# Patient Record
Sex: Male | Born: 1970 | Race: White | Hispanic: No | Marital: Married | Smoking: Former smoker
Health system: Southern US, Community
[De-identification: ages and names within clinical notes are randomized; demographics above are authoritative.]

## PROBLEM LIST (undated history)

## (undated) DIAGNOSIS — J189 Pneumonia, unspecified organism: Secondary | ICD-10-CM

## (undated) DIAGNOSIS — F32A Depression, unspecified: Secondary | ICD-10-CM

## (undated) DIAGNOSIS — R51 Headache: Secondary | ICD-10-CM

## (undated) DIAGNOSIS — F329 Major depressive disorder, single episode, unspecified: Secondary | ICD-10-CM

## (undated) DIAGNOSIS — E7800 Pure hypercholesterolemia, unspecified: Secondary | ICD-10-CM

## (undated) DIAGNOSIS — K219 Gastro-esophageal reflux disease without esophagitis: Secondary | ICD-10-CM

## (undated) HISTORY — PX: APPENDECTOMY: SHX54

---

## 2007-04-08 ENCOUNTER — Emergency Department (HOSPITAL_COMMUNITY): Admission: EM | Admit: 2007-04-08 | Discharge: 2007-04-08 | Payer: Self-pay | Admitting: Emergency Medicine

## 2008-04-18 ENCOUNTER — Emergency Department (HOSPITAL_COMMUNITY): Admission: EM | Admit: 2008-04-18 | Discharge: 2008-04-18 | Payer: Self-pay | Admitting: Emergency Medicine

## 2009-02-24 ENCOUNTER — Emergency Department (HOSPITAL_COMMUNITY): Admission: EM | Admit: 2009-02-24 | Discharge: 2009-02-24 | Payer: Self-pay | Admitting: Emergency Medicine

## 2009-03-15 ENCOUNTER — Emergency Department (HOSPITAL_COMMUNITY): Admission: EM | Admit: 2009-03-15 | Discharge: 2009-03-15 | Payer: Self-pay | Admitting: Emergency Medicine

## 2009-04-26 ENCOUNTER — Ambulatory Visit (HOSPITAL_COMMUNITY): Admission: RE | Admit: 2009-04-26 | Discharge: 2009-04-26 | Payer: Self-pay | Admitting: Family Medicine

## 2009-05-29 ENCOUNTER — Emergency Department (HOSPITAL_COMMUNITY): Admission: EM | Admit: 2009-05-29 | Discharge: 2009-05-29 | Payer: Self-pay | Admitting: Emergency Medicine

## 2009-07-11 ENCOUNTER — Emergency Department (HOSPITAL_COMMUNITY): Admission: EM | Admit: 2009-07-11 | Discharge: 2009-07-12 | Payer: Self-pay | Admitting: Emergency Medicine

## 2011-01-11 LAB — RAPID URINE DRUG SCREEN, HOSP PERFORMED
Benzodiazepines: NOT DETECTED
Cocaine: NOT DETECTED
Opiates: NOT DETECTED

## 2011-01-11 LAB — DIFFERENTIAL
Basophils Absolute: 0.1 10*3/uL (ref 0.0–0.1)
Eosinophils Relative: 6 % — ABNORMAL HIGH (ref 0–5)
Lymphs Abs: 5.9 10*3/uL — ABNORMAL HIGH (ref 0.7–4.0)
Monocytes Absolute: 0.8 10*3/uL (ref 0.1–1.0)
Neutro Abs: 5.3 10*3/uL (ref 1.7–7.7)
Neutrophils Relative %: 41 % — ABNORMAL LOW (ref 43–77)

## 2011-01-11 LAB — COMPREHENSIVE METABOLIC PANEL
Albumin: 4.5 g/dL (ref 3.5–5.2)
CO2: 32 mEq/L (ref 19–32)
Calcium: 9.7 mg/dL (ref 8.4–10.5)
Chloride: 102 mEq/L (ref 96–112)
Creatinine, Ser: 0.96 mg/dL (ref 0.4–1.5)
Glucose, Bld: 93 mg/dL (ref 70–99)
Potassium: 3.7 mEq/L (ref 3.5–5.1)

## 2011-01-11 LAB — CBC
MCHC: 35.2 g/dL (ref 30.0–36.0)
MCV: 86.5 fL (ref 78.0–100.0)
RDW: 13.2 % (ref 11.5–15.5)
WBC: 12.8 10*3/uL — ABNORMAL HIGH (ref 4.0–10.5)

## 2011-01-15 LAB — DIFFERENTIAL
Basophils Relative: 1 % (ref 0–1)
Eosinophils Absolute: 0.6 10*3/uL (ref 0.0–0.7)
Eosinophils Relative: 5 % (ref 0–5)
Lymphs Abs: 4.1 10*3/uL — ABNORMAL HIGH (ref 0.7–4.0)
Monocytes Absolute: 0.9 10*3/uL (ref 0.1–1.0)
Monocytes Relative: 7 % (ref 3–12)
Neutro Abs: 7 10*3/uL (ref 1.7–7.7)
Neutrophils Relative %: 55 % (ref 43–77)

## 2011-01-15 LAB — COMPREHENSIVE METABOLIC PANEL
Albumin: 4.1 g/dL (ref 3.5–5.2)
Alkaline Phosphatase: 71 U/L (ref 39–117)
Calcium: 9.9 mg/dL (ref 8.4–10.5)
Chloride: 101 mEq/L (ref 96–112)
Creatinine, Ser: 0.97 mg/dL (ref 0.4–1.5)
Glucose, Bld: 92 mg/dL (ref 70–99)
Sodium: 138 mEq/L (ref 135–145)
Total Bilirubin: 0.4 mg/dL (ref 0.3–1.2)

## 2011-01-15 LAB — CBC
Platelets: 263 10*3/uL (ref 150–400)
RDW: 13.1 % (ref 11.5–15.5)

## 2011-01-15 LAB — LIPASE, BLOOD: Lipase: 23 U/L (ref 11–59)

## 2011-01-16 LAB — BASIC METABOLIC PANEL
BUN: 7 mg/dL (ref 6–23)
CO2: 29 mEq/L (ref 19–32)
Chloride: 102 mEq/L (ref 96–112)
Creatinine, Ser: 0.94 mg/dL (ref 0.4–1.5)
Potassium: 3.8 mEq/L (ref 3.5–5.1)

## 2011-01-16 LAB — DIFFERENTIAL
Eosinophils Absolute: 0.4 10*3/uL (ref 0.0–0.7)
Eosinophils Relative: 4 % (ref 0–5)

## 2011-01-16 LAB — CBC
Hemoglobin: 16.3 g/dL (ref 13.0–17.0)
Platelets: 249 10*3/uL (ref 150–400)
RDW: 13.4 % (ref 11.5–15.5)
WBC: 10.4 10*3/uL (ref 4.0–10.5)

## 2011-01-16 LAB — POCT CARDIAC MARKERS

## 2011-01-23 ENCOUNTER — Emergency Department (HOSPITAL_COMMUNITY): Payer: Medicaid Other

## 2011-01-23 ENCOUNTER — Emergency Department (HOSPITAL_COMMUNITY)
Admission: EM | Admit: 2011-01-23 | Discharge: 2011-01-23 | Disposition: A | Discharge status: Home / Self Care | Payer: Medicaid Other | Attending: Emergency Medicine | Admitting: Emergency Medicine

## 2011-01-23 DIAGNOSIS — R112 Nausea with vomiting, unspecified: Secondary | ICD-10-CM | POA: Insufficient documentation

## 2011-01-23 DIAGNOSIS — R05 Cough: Secondary | ICD-10-CM | POA: Insufficient documentation

## 2011-01-23 DIAGNOSIS — R059 Cough, unspecified: Secondary | ICD-10-CM | POA: Insufficient documentation

## 2011-01-23 DIAGNOSIS — B9789 Other viral agents as the cause of diseases classified elsewhere: Secondary | ICD-10-CM | POA: Insufficient documentation

## 2011-10-27 ENCOUNTER — Emergency Department (HOSPITAL_COMMUNITY)
Admission: EM | Admit: 2011-10-27 | Discharge: 2011-10-28 | Disposition: A | Discharge status: Home / Self Care | Payer: Medicaid Other | Attending: Emergency Medicine | Admitting: Emergency Medicine

## 2011-10-27 ENCOUNTER — Encounter (HOSPITAL_COMMUNITY): Payer: Self-pay | Admitting: *Deleted

## 2011-10-27 DIAGNOSIS — F329 Major depressive disorder, single episode, unspecified: Secondary | ICD-10-CM | POA: Insufficient documentation

## 2011-10-27 DIAGNOSIS — R05 Cough: Secondary | ICD-10-CM

## 2011-10-27 DIAGNOSIS — E7800 Pure hypercholesterolemia, unspecified: Secondary | ICD-10-CM | POA: Insufficient documentation

## 2011-10-27 DIAGNOSIS — R1907 Generalized intra-abdominal and pelvic swelling, mass and lump: Secondary | ICD-10-CM | POA: Insufficient documentation

## 2011-10-27 DIAGNOSIS — F3289 Other specified depressive episodes: Secondary | ICD-10-CM | POA: Insufficient documentation

## 2011-10-27 DIAGNOSIS — R059 Cough, unspecified: Secondary | ICD-10-CM | POA: Insufficient documentation

## 2011-10-27 HISTORY — DX: Pure hypercholesterolemia, unspecified: E78.00

## 2011-10-27 HISTORY — DX: Major depressive disorder, single episode, unspecified: F32.9

## 2011-10-27 HISTORY — DX: Depression, unspecified: F32.A

## 2011-10-27 NOTE — ED Notes (Signed)
Pt c/o cough, lower abdominal pressure, and diarrhea for "some time now."

## 2011-10-28 LAB — URINALYSIS, ROUTINE W REFLEX MICROSCOPIC
Bilirubin Urine: NEGATIVE
Glucose, UA: NEGATIVE mg/dL
Ketones, ur: NEGATIVE mg/dL
Leukocytes, UA: NEGATIVE
Nitrite: NEGATIVE
Protein, ur: NEGATIVE mg/dL
Specific Gravity, Urine: >1.03 — ABNORMAL HIGH (ref 1.005–1.030)
Urobilinogen, UA: 0.2 mg/dL (ref 0.0–1.0)

## 2011-10-28 NOTE — ED Provider Notes (Signed)
History     CSN: [id number]  Arrival date & time 10/27/11  2345   First MD Initiated Contact with Patient 10/28/11 0007      Chief Complaint  Patient presents with  . Abdominal Pain  . Cough    (Consider location/radiation/quality/duration/timing/severity/associated sxs/prior treatment) HPI Patient presents with complaint of mild cough, sensation of abdominal swelling. He states the symptoms have been well on for several months. He states he has seen his primary doctor for the same symptoms. He states he sometimes feels dysuria. He's had no vomiting and no fevers or chills. He denies any abdominal surgeries. He has been eating and drinking normally. He endorses having had these symptoms multiple times in the past without any serious diagnosis. He denies any blood in his stool or in his urine. He states he stopped smoking 2 years ago. He's had no chest pain or difficulty breathing. Symptoms are mild. Symptoms are continuous. There no other alleviating or modifying factors. There no other associated systemic symptoms.  Past Medical History  Diagnosis Date  . Depression   . High cholesterol     History reviewed. No pertinent past surgical history.  History reviewed. No pertinent family history.  History  Substance Use Topics  . Smoking status: Not on file  . Smokeless tobacco: Not on file  . Alcohol Use: Yes     rarely      Review of Systems ROS reviewed and otherwise negative except for mentioned in HPI  Allergies  Darvocet  Home Medications   Current Outpatient Rx  Name Route Sig Dispense Refill  . PAROXETINE HCL 20 MG PO TABS Oral Take 20 mg by mouth every morning.    Marland Kitchen PRAVASTATIN SODIUM 40 MG PO TABS Oral Take 40 mg by mouth daily.      BP 153/92  Pulse 75  Temp 98 F (36.7 C)  Resp 20  Ht 6' (1.829 m)  Wt 174 lb (78.926 kg)  BMI 23.60 kg/m2  SpO2 100% Vitals reviewed Physical Exam Physical Examination: General appearance - alert, well appearing, and  in no distress Mental status - alert, oriented to person, place, and time Eyes - pupils equal and reactive, extraocular eye movements intact Mouth - mucous membranes moist, pharynx normal without lesions Chest - clear to auscultation, no wheezes, rales or rhonchi, symmetric air entry, no increased respiratory effort Heart - normal rate, regular rhythm, normal S1, S2, no murmurs, rubs, clicks or gallops Abdomen - soft, nontender, nondistended, no masses or organomegaly, normal active bowel sounds Skin - normal coloration and turgor, no rashes, no suspicious skin lesions noted  ED Course  Procedures (including critical care time)  Prior chart, ED visits and xrays reviewed by me during this visit  Labs Reviewed  URINALYSIS, ROUTINE W REFLEX MICROSCOPIC - Abnormal; Notable for the following:    Specific Gravity, Urine >1.030 (*)    All other components within normal limits   No results found.   1. Cough   2. Generalized abdominal swelling       MDM  Patient presents with complaint of sensation of abdominal swelling which has been ongoing for months. His abdomen is nontender nondistended there are normal bowel sounds. He also complains of mild cough. His lungs are clear there is no wheezing. He states he is not a smoker. He is nontoxic and well-hydrated in appearance. The urinalysis revealed increased specific gravity. He was advised to increase his fluid intake. Otherwise I do not suspect an acute emergent condition at  this time. And the patient is recommended to followup with his primary doctor or for a recheck. He was given strict return precautions and is agreeable with this plan.        Ethelda Chick, MD 10/28/11 [phone]

## 2012-03-27 ENCOUNTER — Encounter (HOSPITAL_COMMUNITY): Payer: Self-pay

## 2012-03-27 ENCOUNTER — Emergency Department (HOSPITAL_COMMUNITY)
Admission: EM | Admit: 2012-03-27 | Discharge: 2012-03-27 | Disposition: A | Discharge status: Home / Self Care | Payer: Medicaid Other | Attending: Emergency Medicine | Admitting: Emergency Medicine

## 2012-03-27 DIAGNOSIS — J029 Acute pharyngitis, unspecified: Secondary | ICD-10-CM

## 2012-03-27 DIAGNOSIS — E7800 Pure hypercholesterolemia, unspecified: Secondary | ICD-10-CM | POA: Insufficient documentation

## 2012-03-27 LAB — RAPID STREP SCREEN (MED CTR MEBANE ONLY): Streptococcus, Group A Screen (Direct): NEGATIVE

## 2012-03-27 MED ORDER — IBUPROFEN 800 MG PO TABS: 800.0000 mg | ORAL_TABLET | Freq: Three times a day (TID) | ORAL | Status: AC

## 2012-03-27 MED ORDER — MINOCYCLINE HCL 100 MG PO CAPS: 100.0000 mg | ORAL_CAPSULE | Freq: Two times a day (BID) | ORAL | Status: AC

## 2012-03-27 NOTE — ED Provider Notes (Signed)
History     CSN: [id number]  Arrival date & time 03/27/12  1726   First MD Initiated Contact with Patient 03/27/12 1808      Chief Complaint  Patient presents with  . Sore Throat    (Consider location/radiation/quality/duration/timing/severity/associated sxs/prior treatment) Patient is a 41 y.o. male presenting with pharyngitis. The history is provided by the patient.  Sore Throat This is a new problem. The current episode started in the past 7 days. The problem occurs 2 to 4 times per day. The problem has been unchanged. Associated symptoms include myalgias and a sore throat. Pertinent negatives include no abdominal pain, arthralgias, chest pain, congestion, coughing, neck pain or rash. Associated symptoms comments: hoarseness. The symptoms are aggravated by swallowing. He has tried nothing for the symptoms. The treatment provided no relief.    Past Medical History  Diagnosis Date  . Depression   . High cholesterol     History reviewed. No pertinent past surgical history.  No family history on file.  History  Substance Use Topics  . Smoking status: Never Smoker   . Smokeless tobacco: Not on file  . Alcohol Use: Yes     rarely      Review of Systems  Constitutional: Negative for activity change.       All ROS Neg except as noted in HPI  HENT: Positive for sore throat. Negative for nosebleeds, congestion and neck pain.   Eyes: Negative for photophobia and discharge.  Respiratory: Negative for cough, shortness of breath and wheezing.   Cardiovascular: Negative for chest pain and palpitations.  Gastrointestinal: Negative for abdominal pain and blood in stool.  Genitourinary: Negative for dysuria, frequency and hematuria.  Musculoskeletal: Positive for myalgias. Negative for back pain and arthralgias.  Skin: Negative.  Negative for rash.  Neurological: Negative for dizziness, seizures and speech difficulty.  Psychiatric/Behavioral: Negative for hallucinations and  confusion.    Allergies  Darvocet  Home Medications   Current Outpatient Rx  Name Route Sig Dispense Refill  . IBUPROFEN 800 MG PO TABS Oral Take 1 tablet (800 mg total) by mouth 3 (three) times daily. 21 tablet 0  . MINOCYCLINE HCL 100 MG PO CAPS Oral Take 1 capsule (100 mg total) by mouth 2 (two) times daily. 12 capsule 0  . PAROXETINE HCL 20 MG PO TABS Oral Take 20 mg by mouth every morning.    Marland Kitchen PRAVASTATIN SODIUM 40 MG PO TABS Oral Take 40 mg by mouth daily.      BP 128/89  Pulse 86  Temp 98.1 F (36.7 C) (Oral)  Resp 20  Ht 6' (1.829 m)  Wt 174 lb (78.926 kg)  BMI 23.60 kg/m2  SpO2 99%  Physical Exam  Nursing note and vitals reviewed. Constitutional: He is oriented to person, place, and time. He appears well-developed and well-nourished.  Non-toxic appearance.  HENT:  Head: Normocephalic.  Right Ear: Tympanic membrane and external ear normal.  Left Ear: Tympanic membrane and external ear normal.       There is increased redness of the posterior pharynx. There is no evidence of abscess. The uvula is enlarged but in the midline. There is hoarseness present.  Eyes: EOM and lids are normal. Pupils are equal, round, and reactive to light.  Neck: Normal range of motion. Neck supple. Carotid bruit is not present.  Cardiovascular: Normal rate, regular rhythm, normal heart sounds, intact distal pulses and normal pulses.   Pulmonary/Chest: Breath sounds normal. No respiratory distress.  Abdominal: Soft. Bowel sounds  are normal. There is no tenderness. There is no guarding.  Musculoskeletal: Normal range of motion.  Lymphadenopathy:       Head (right side): No submandibular adenopathy present.       Head (left side): No submandibular adenopathy present.    He has no cervical adenopathy.  Neurological: He is alert and oriented to person, place, and time. He has normal strength. No cranial nerve deficit or sensory deficit.  Skin: Skin is warm and dry.  Psychiatric: He has a  normal mood and affect. His speech is normal.    ED Course  Procedures (including critical care time)   Labs Reviewed  RAPID STREP SCREEN   No results found.   1. Pharyngitis       MDM  I have reviewed nursing notes, vital signs, and all appropriate lab and imaging results for this patient. Patient advised to use salt water gargles. Prescription for ibuprofen 3 times daily with food, and minutes and 2 times daily with food given to the patient. Patient is invited to return if any changes, problems, or concerns.       Kathie Dike, [place] 03/27/12 [phone]

## 2012-03-27 NOTE — ED Notes (Signed)
Pt reports losing voice 2-3 days ago, sore throat at times.

## 2012-03-27 NOTE — ED Notes (Signed)
Pt c/o sore throat x 2-3 days. Strep screen done.

## 2012-03-27 NOTE — Discharge Instructions (Signed)

## 2012-03-27 NOTE — ED Provider Notes (Signed)
Medical screening examination/treatment/procedure(s) were performed by non-physician practitioner and as supervising physician I was immediately available for consultation/collaboration.   Tiant Peixoto, MD 03/27/12 1923 

## 2012-04-04 ENCOUNTER — Emergency Department (HOSPITAL_COMMUNITY)
Admission: EM | Admit: 2012-04-04 | Discharge: 2012-04-04 | Disposition: A | Discharge status: Home / Self Care | Payer: Medicaid Other | Attending: Emergency Medicine | Admitting: Emergency Medicine

## 2012-04-04 ENCOUNTER — Encounter (HOSPITAL_COMMUNITY): Payer: Self-pay | Admitting: *Deleted

## 2012-04-04 DIAGNOSIS — E7800 Pure hypercholesterolemia, unspecified: Secondary | ICD-10-CM | POA: Insufficient documentation

## 2012-04-04 DIAGNOSIS — F329 Major depressive disorder, single episode, unspecified: Secondary | ICD-10-CM | POA: Insufficient documentation

## 2012-04-04 DIAGNOSIS — Z79899 Other long term (current) drug therapy: Secondary | ICD-10-CM | POA: Insufficient documentation

## 2012-04-04 DIAGNOSIS — R112 Nausea with vomiting, unspecified: Secondary | ICD-10-CM | POA: Insufficient documentation

## 2012-04-04 DIAGNOSIS — F3289 Other specified depressive episodes: Secondary | ICD-10-CM | POA: Insufficient documentation

## 2012-04-04 DIAGNOSIS — R197 Diarrhea, unspecified: Secondary | ICD-10-CM | POA: Insufficient documentation

## 2012-04-04 MED ORDER — ONDANSETRON 8 MG PO TBDP
8.0000 mg | ORAL_TABLET | Freq: Once | ORAL | Status: AC
  Administered 2012-04-04: 8 mg via ORAL
  Filled 2012-04-04: qty 1

## 2012-04-04 MED ORDER — ACETAMINOPHEN 325 MG PO TABS
650.0000 mg | ORAL_TABLET | Freq: Once | ORAL | Status: AC
  Administered 2012-04-04: 650 mg via ORAL
  Filled 2012-04-04: qty 2

## 2012-04-04 NOTE — Discharge Instructions (Signed)
Diet for Diarrhea, Adult Having frequent, runny stools (diarrhea) has many causes. Diarrhea may be caused or worsened by food or drink. Diarrhea may be relieved by changing your diet. IF YOU ARE NOT TOLERATING SOLID FOODS:  Drink enough water and fluids to keep your urine clear or pale yellow.   Avoid sugary drinks and sodas as well as milk-based beverages.   Avoid beverages containing caffeine and alcohol.   You may try rehydrating beverages. You can make your own by following this recipe:    tsp table salt.    tsp baking soda.   ? tsp salt substitute (potassium chloride).   1 tbs + 1 tsp sugar.   1 qt water.  As your stools become more solid, you can start eating solid foods. Add foods one at a time. If a certain food causes your diarrhea to get worse, avoid that food and try other foods. A low fiber, low-fat, and lactose-free diet is recommended. Small, frequent meals may be better tolerated.  Starches  Allowed:  White, French, and pita breads, plain rolls, buns, bagels. Plain muffins, matzo. Soda, saltine, or graham crackers. Pretzels, melba toast, zwieback. Cooked cereals made with water: cornmeal, farina, cream cereals. Dry cereals: refined corn, wheat, rice. Potatoes prepared any way without skins, refined macaroni, spaghetti, noodles, refined rice.   Avoid:  Bread, rolls, or crackers made with whole wheat, multi-grains, rye, bran seeds, nuts, or coconut. Corn tortillas or taco shells. Cereals containing whole grains, multi-grains, bran, coconut, nuts, or raisins. Cooked or dry oatmeal. Coarse wheat cereals, granola. Cereals advertised as "high-fiber." Potato skins. Whole grain pasta, wild or brown rice. Popcorn. Sweet potatoes/yams. Sweet rolls, doughnuts, waffles, pancakes, sweet breads.  Vegetables  Allowed: Strained tomato and vegetable juices. Most well-cooked and canned vegetables without seeds. Fresh: Tender lettuce, cucumber without the skin, cabbage, spinach, bean  sprouts.   Avoid: Fresh, cooked, or canned: Artichokes, baked beans, beet greens, broccoli, Brussels sprouts, corn, kale, legumes, peas, sweet potatoes. Cooked: Green or red cabbage, spinach. Avoid large servings of any vegetables, because vegetables shrink when cooked, and they contain more fiber per serving than fresh vegetables.  Fruit  Allowed: All fruit juices except prune juice. Cooked or canned: Apricots, applesauce, cantaloupe, cherries, fruit cocktail, grapefruit, grapes, kiwi, mandarin oranges, peaches, pears, plums, watermelon. Fresh: Apples without skin, ripe banana, grapes, cantaloupe, cherries, grapefruit, peaches, oranges, plums. Keep servings limited to  cup or 1 piece.   Avoid: Fresh: Apple with skin, apricots, mango, pears, raspberries, strawberries. Prune juice, stewed or dried prunes. Dried fruits, raisins, dates. Large servings of all fresh fruits.  Meat and Meat Substitutes  Allowed: Ground or well-cooked tender beef, ham, veal, lamb, pork, or poultry. Eggs, plain cheese. Fish, oysters, shrimp, lobster, other seafoods. Liver, organ meats.   Avoid: Tough, fibrous meats with gristle. Peanut butter, smooth or chunky. Cheese, nuts, seeds, legumes, dried peas, beans, lentils.  Milk  Allowed: Yogurt, lactose-free milk, kefir, drinkable yogurt, buttermilk, soy milk.   Avoid: Milk, chocolate milk, beverages made with milk, such as milk shakes.  Soups  Allowed: Bouillon, broth, or soups made from allowed foods. Any strained soup.   Avoid: Soups made from vegetables that are not allowed, cream or milk-based soups.  Desserts and Sweets  Allowed: Sugar-free gelatin, sugar-free frozen ice pops made without sugar alcohol.   Avoid: Plain cakes and cookies, pie made with allowed fruit, pudding, custard, cream pie. Gelatin, fruit, ice, sherbet, frozen ice pops. Ice cream, ice milk without nuts. Plain hard candy,   honey, jelly, molasses, syrup, sugar, chocolate syrup, gumdrops,  marshmallows.  Fats and Oils  Allowed: Avoid any fats and oils.   Avoid: Seeds, nuts, olives, avocados. Margarine, butter, cream, mayonnaise, salad oils, plain salad dressings made from allowed foods. Plain gravy, crisp bacon without rind.  Beverages  Allowed: Water, decaffeinated teas, oral rehydration solutions, sugar-free beverages.   Avoid: Fruit juices, caffeinated beverages (coffee, tea, soda or pop), alcohol, sports drinks, or lemon-lime soda or pop.  Condiments  Allowed: Ketchup, mustard, horseradish, vinegar, cream sauce, cheese sauce, cocoa powder. Spices in moderation: allspice, basil, bay leaves, celery powder or leaves, cinnamon, cumin powder, curry powder, ginger, mace, marjoram, onion or garlic powder, oregano, paprika, parsley flakes, ground pepper, rosemary, sage, savory, tarragon, thyme, turmeric.   Avoid: Coconut, honey.  Weight Monitoring: Weigh yourself every day. You should weigh yourself in the morning after you urinate and before you eat breakfast. Wear the same amount of clothing when you weigh yourself. Record your weight daily. Bring your recorded weights to your clinic visits. Tell your caregiver right away if you have gained 3 lb/1.4 kg or more in 1 day, 5 lb/2.3 kg in a week, or whatever amount you were told to report. SEEK IMMEDIATE MEDICAL CARE IF:   You are unable to keep fluids down.   You start to throw up (vomit) or diarrhea keeps coming back (persistent).   Abdominal pain develops, increases, or can be felt in one place (localizes).   You have an oral temperature above 102 F (38.9 C), not controlled by medicine.   Diarrhea contains blood or mucus.   You develop excessive weakness, dizziness, fainting, or extreme thirst.  MAKE SURE YOU:   Understand these instructions.   Will watch your condition.   Will get help right away if you are not doing well or get worse.  Document Released: 12/15/2003 Document Revised: 09/13/2011 Document Reviewed:  04/07/2009 [employer] Patient Information 2012 [employer]. 

## 2012-04-04 NOTE — ED Notes (Signed)
Pt ambulatory with steady gait and no complaints; No nausea at this time

## 2012-04-04 NOTE — ED Provider Notes (Signed)
History     CSN: [id number]  Arrival date & time 04/04/12  0129   First MD Initiated Contact with Patient 04/04/12 0133      Chief Complaint  Patient presents with  . Migraine  . Nausea  . Diarrhea     Patient is a 41 y.o. male presenting with diarrhea. The history is provided by the patient.  Diarrhea The primary symptoms include nausea, vomiting and diarrhea. Primary symptoms do not include fever, abdominal pain, melena, hematemesis, hematochezia or rash. The illness began today. The onset was gradual. The problem has been gradually worsening.  The illness is also significant for chills. Associated symptoms comments: Headache .   Pt reports that he had mild headache and soon after developed vomiting and diarrhea (nonbloody) Denies abdominal pain, no CP reported No fever No rash, no tick bites.  No cough.  He feels generalized weakness but no syncope No international travel No sick contacts, but does report recent stress with ill family member  Past Medical History  Diagnosis Date  . Depression   . High cholesterol     History reviewed. No pertinent past surgical history.  History reviewed. No pertinent family history.  History  Substance Use Topics  . Smoking status: Never Smoker   . Smokeless tobacco: Not on file  . Alcohol Use: Yes     rarely      Review of Systems  Constitutional: Positive for chills. Negative for fever.  Gastrointestinal: Positive for nausea, vomiting and diarrhea. Negative for abdominal pain, melena, hematochezia and hematemesis.  Skin: Negative for rash.  All other systems reviewed and are negative.    Allergies  Darvocet  Home Medications   Current Outpatient Rx  Name Route Sig Dispense Refill  . IBUPROFEN 800 MG PO TABS Oral Take 1 tablet (800 mg total) by mouth 3 (three) times daily. 21 tablet 0  . MINOCYCLINE HCL 100 MG PO CAPS Oral Take 1 capsule (100 mg total) by mouth 2 (two) times daily. 12 capsule 0  . PAROXETINE HCL  20 MG PO TABS Oral Take 20 mg by mouth every morning.    Marland Kitchen PRAVASTATIN SODIUM 40 MG PO TABS Oral Take 40 mg by mouth daily.      BP 115/73  Pulse 63  Temp 97.6 F (36.4 C) (Oral)  Resp 20  Ht 6' (1.829 m)  Wt 174 lb (78.926 kg)  BMI 23.60 kg/m2  SpO2 99%  Physical Exam CONSTITUTIONAL: Well developed/well nourished HEAD AND FACE: Normocephalic/atraumatic EYES: EOMI/PERRL, no scleral icterus ENMT: Mucous membranes moist NECK: supple no meningeal signs SPINE:entire spine nontender CV: S1/S2 noted, no murmurs/rubs/gallops noted Chest - pectus excavatum LUNGS: Lungs are clear to auscultation bilaterally, no apparent distress ABDOMEN: soft, nontender, no rebound or guarding, nondistended, +BS GU:no cva tenderness NEURO: Pt is awake/alert, moves all extremitiesx4 No arm/drift.  No facial droop.  No past pointing.  Normal gait EXTREMITIES: pulses normal, full ROM SKIN: warm, color normal PSYCH: no abnormalities of mood noted  ED Course  Procedures  Pt tolerating PO fluids Well appearing Stable for d/c    MDM  Nursing notes including past medical history and social history reviewed and considered in documentation         Joya Gaskins, MD 04/04/12 [phone]

## 2012-04-04 NOTE — ED Notes (Signed)
Pt started with ha with N/V/D. Pt reports "feeling like I am going to pass out" Associated sweating also today with hot flashes

## 2012-04-04 NOTE — ED Notes (Signed)
PO fluids given to pt. Pt tolerating at this time

## 2012-10-21 ENCOUNTER — Other Ambulatory Visit (HOSPITAL_COMMUNITY): Payer: Self-pay | Admitting: Family Medicine

## 2012-10-21 ENCOUNTER — Ambulatory Visit (HOSPITAL_COMMUNITY)
Admission: RE | Admit: 2012-10-21 | Discharge: 2012-10-21 | Disposition: A | Discharge status: Home / Self Care | Payer: Medicaid Other | Source: Ambulatory Visit | Attending: Family Medicine | Admitting: Family Medicine

## 2012-10-21 DIAGNOSIS — R05 Cough: Secondary | ICD-10-CM | POA: Insufficient documentation

## 2012-10-21 DIAGNOSIS — R918 Other nonspecific abnormal finding of lung field: Secondary | ICD-10-CM

## 2012-10-21 DIAGNOSIS — R059 Cough, unspecified: Secondary | ICD-10-CM | POA: Insufficient documentation

## 2012-10-21 DIAGNOSIS — R0602 Shortness of breath: Secondary | ICD-10-CM | POA: Insufficient documentation

## 2013-01-07 ENCOUNTER — Encounter (HOSPITAL_COMMUNITY): Payer: Self-pay | Admitting: *Deleted

## 2013-01-07 ENCOUNTER — Emergency Department (HOSPITAL_COMMUNITY): Payer: Medicaid Other

## 2013-01-07 ENCOUNTER — Emergency Department (HOSPITAL_COMMUNITY)
Admission: EM | Admit: 2013-01-07 | Discharge: 2013-01-07 | Disposition: A | Discharge status: Home / Self Care | Payer: Medicaid Other | Attending: Emergency Medicine | Admitting: Emergency Medicine

## 2013-01-07 DIAGNOSIS — Z79899 Other long term (current) drug therapy: Secondary | ICD-10-CM | POA: Insufficient documentation

## 2013-01-07 DIAGNOSIS — F329 Major depressive disorder, single episode, unspecified: Secondary | ICD-10-CM | POA: Insufficient documentation

## 2013-01-07 DIAGNOSIS — Y939 Activity, unspecified: Secondary | ICD-10-CM | POA: Insufficient documentation

## 2013-01-07 DIAGNOSIS — S139XXA Sprain of joints and ligaments of unspecified parts of neck, initial encounter: Secondary | ICD-10-CM | POA: Insufficient documentation

## 2013-01-07 DIAGNOSIS — F3289 Other specified depressive episodes: Secondary | ICD-10-CM | POA: Insufficient documentation

## 2013-01-07 DIAGNOSIS — Y929 Unspecified place or not applicable: Secondary | ICD-10-CM | POA: Insufficient documentation

## 2013-01-07 DIAGNOSIS — X58XXXA Exposure to other specified factors, initial encounter: Secondary | ICD-10-CM | POA: Insufficient documentation

## 2013-01-07 DIAGNOSIS — E7800 Pure hypercholesterolemia, unspecified: Secondary | ICD-10-CM | POA: Insufficient documentation

## 2013-01-07 DIAGNOSIS — S161XXA Strain of muscle, fascia and tendon at neck level, initial encounter: Secondary | ICD-10-CM

## 2013-01-07 MED ORDER — NAPROXEN 500 MG PO TABS: 500.0000 mg | ORAL_TABLET | Freq: Two times a day (BID) | ORAL | Status: DC

## 2013-01-07 MED ORDER — CYCLOBENZAPRINE HCL 10 MG PO TABS
10.0000 mg | ORAL_TABLET | Freq: Once | ORAL | Status: AC
  Administered 2013-01-07: 10 mg via ORAL
  Filled 2013-01-07: qty 1

## 2013-01-07 MED ORDER — CYCLOBENZAPRINE HCL 10 MG PO TABS: 10.0000 mg | ORAL_TABLET | Freq: Three times a day (TID) | ORAL | Status: DC | PRN

## 2013-01-07 MED ORDER — HYDROCODONE-ACETAMINOPHEN 5-325 MG PO TABS
1.0000 | ORAL_TABLET | Freq: Once | ORAL | Status: AC
  Administered 2013-01-07: 1 via ORAL
  Filled 2013-01-07: qty 1

## 2013-01-07 MED ORDER — TRAMADOL HCL 50 MG PO TABS: 50.0000 mg | ORAL_TABLET | Freq: Four times a day (QID) | ORAL | Status: DC | PRN

## 2013-01-07 NOTE — ED Notes (Signed)
Pt with neck pain for last couple days, denies injury to neck, hx of same about 4 years ago

## 2013-01-07 NOTE — ED Provider Notes (Signed)
History     CSN: [id number]  Arrival date & time 01/07/13  1836   First MD Initiated Contact with Patient 01/07/13 1915      Chief Complaint  Patient presents with  . Neck Pain    (Consider location/radiation/quality/duration/timing/severity/associated sxs/prior treatment) HPI Comments: patient is a 42 year old male who presents to the emergency department with complaint of right-sided neck pain for 2 days. He states that this is a reoccurring problem. Pain to his right knee began after an injury 4 years ago. He describes an aching pain to the right side of his neck and into his shoulder. Pain improves with certain positions and rest and worsened with movement of his neck and right arm. He denies any recent injury, chest pain, shortness of breath, headache, visual change, numbness or weakness of the upper extremities.  The history is provided by the patient.    Past Medical History  Diagnosis Date  . Depression   . High cholesterol     History reviewed. No pertinent past surgical history.  History reviewed. No pertinent family history.  History  Substance Use Topics  . Smoking status: Never Smoker   . Smokeless tobacco: Not on file  . Alcohol Use: Yes     Comment: rarely      Review of Systems  Constitutional: Negative for fever and chills.  HENT: Positive for neck pain. Negative for facial swelling, trouble swallowing and neck stiffness.   Eyes: Negative for photophobia and visual disturbance.  Respiratory: Negative for chest tightness and shortness of breath.   Cardiovascular: Negative for chest pain.  Gastrointestinal: Negative for nausea and vomiting.  Genitourinary: Negative for dysuria and difficulty urinating.  Musculoskeletal: Positive for joint swelling and arthralgias.  Skin: Negative for color change and wound.  Neurological: Negative for dizziness, syncope, facial asymmetry, speech difficulty, weakness, light-headedness, numbness and headaches.    Psychiatric/Behavioral: Negative for confusion. The patient is not nervous/anxious.   All other systems reviewed and are negative.    Allergies  Darvocet  Home Medications   Current Outpatient Rx  Name  Route  Sig  Dispense  Refill  . PARoxetine (PAXIL) 20 MG tablet   Oral   Take 20 mg by mouth every morning.          . pravastatin (PRAVACHOL) 40 MG tablet   Oral   Take 40 mg by mouth every morning.            BP 145/81  Pulse 80  Temp(Src) 97.5 F (36.4 C) (Oral)  Resp 20  Ht 6' (1.829 m)  Wt 174 lb (78.926 kg)  BMI 23.59 kg/m2  SpO2 99%  Physical Exam  Nursing note and vitals reviewed. Constitutional: He is oriented to person, place, and time. He appears well-developed and well-nourished. No distress.  HENT:  Head: Normocephalic and atraumatic.  Right Ear: Tympanic membrane and ear canal normal.  Left Ear: Tympanic membrane and ear canal normal.  Mouth/Throat: Uvula is midline, oropharynx is clear and moist and mucous membranes are normal. No oropharyngeal exudate.  Eyes: EOM are normal. Pupils are equal, round, and reactive to light.  Neck: Phonation normal. Neck supple. Muscular tenderness present. No spinous process tenderness present. No rigidity. No erythema present. No Brudzinski's sign and no Kernig's sign noted. No thyromegaly present.    ttp of the right cervical paraspinal muscles and right trapezius muscle.  Grip strength is strong and equal bilaterally.  Distal Sensation intact,  CR < 2 sec.  Pain to the neck  is reproduced with palpation and rotation of the neck to the right.  Cardiovascular: Normal rate, regular rhythm, normal heart sounds and intact distal pulses.   No murmur heard. Pulmonary/Chest: Effort normal and breath sounds normal. No respiratory distress. He exhibits no tenderness.  Musculoskeletal: He exhibits tenderness. He exhibits no edema.       Cervical back: He exhibits tenderness. He exhibits normal range of motion, no bony  tenderness, no swelling, no deformity, no spasm and normal pulse.  See neck exam  Lymphadenopathy:    He has no cervical adenopathy.  Neurological: He is alert and oriented to person, place, and time. No cranial nerve deficit or sensory deficit. He exhibits normal muscle tone. Coordination normal.  Reflex Scores:      Tricep reflexes are 2+ on the right side and 2+ on the left side.      Bicep reflexes are 2+ on the right side and 2+ on the left side. Skin: Skin is warm and dry.    ED Course  Procedures (including critical care time)  Labs Reviewed - No data to display No results found.  Dg Cervical Spine Complete  01/07/2013  *RADIOLOGY REPORT*  Clinical Data: Neck pain.  CERVICAL SPINE - COMPLETE 4+ VIEW  Comparison: None  Findings: The lateral film demonstrates normal alignment of the cervical vertebral bodies.  Disc spaces and vertebral bodies are maintained.  No acute bony findings or abnormal prevertebral soft tissue swelling.  The oblique films demonstrate normally aligned articular facets and patent neural foramen.  The C1-C2 articulations are maintained. The lung apices are clear.  Small cervical ribs are noted.  IMPRESSION: Normal alignment and no acute bony findings.   Original Report Authenticated By: Rudie Meyer, M.D.       MDM    Patient has ttp of the right cervical paraspinal muscles.  No focal neuro deficits on exam.  Ambulates with a steady gait, no meningeal signs, pt is well appearing.  Likely cervical strain.  Doubt infectious or cardiac process.  Will treat with flexeril, ultram and naproxen.  He agrees to f/u with Dr. Janna Arch for recheck  On re-eval, pt is feeling better, pain has improved and he is requesting to go home.  The patient appears reasonably screened and/or stabilized for discharge and I doubt any other medical condition or other [facility] requiring further screening, evaluation, or treatment in the ED at this time prior to discharge.       Ladashia Demarinis L.  Trisha Mangle, PA-C 01/07/13 2107

## 2013-01-08 NOTE — ED Provider Notes (Signed)
Medical screening examination/treatment/procedure(s) were performed by non-physician practitioner and as supervising physician I was immediately available for consultation/collaboration.   Ica Daye, MD 01/08/13 0118 

## 2013-01-10 ENCOUNTER — Telehealth (HOSPITAL_COMMUNITY): Payer: Self-pay | Admitting: Emergency Medicine

## 2013-01-10 NOTE — ED Notes (Signed)
Pharmacy called stating patient brought in the Rx for Tramadol with the other Rxs cut off. Okayed to fill Rx for Tramadol by Norm Parcel [facility].

## 2013-01-19 ENCOUNTER — Ambulatory Visit (HOSPITAL_COMMUNITY)
Admission: RE | Admit: 2013-01-19 | Discharge: 2013-01-19 | Disposition: A | Discharge status: Home / Self Care | Payer: Medicaid Other | Source: Ambulatory Visit | Attending: Family Medicine | Admitting: Family Medicine

## 2013-01-19 ENCOUNTER — Other Ambulatory Visit (HOSPITAL_COMMUNITY): Payer: Self-pay | Admitting: Family Medicine

## 2013-01-19 DIAGNOSIS — J929 Pleural plaque without asbestos: Secondary | ICD-10-CM

## 2013-01-19 DIAGNOSIS — Z09 Encounter for follow-up examination after completed treatment for conditions other than malignant neoplasm: Secondary | ICD-10-CM | POA: Insufficient documentation

## 2013-01-19 DIAGNOSIS — J189 Pneumonia, unspecified organism: Secondary | ICD-10-CM | POA: Insufficient documentation

## 2013-05-11 ENCOUNTER — Observation Stay (HOSPITAL_COMMUNITY)
Admission: EM | Admit: 2013-05-11 | Discharge: 2013-05-12 | Disposition: A | Discharge status: Home / Self Care | Payer: Medicaid Other | Attending: General Surgery | Admitting: General Surgery

## 2013-05-11 ENCOUNTER — Emergency Department (HOSPITAL_COMMUNITY): Payer: Medicaid Other

## 2013-05-11 ENCOUNTER — Encounter (HOSPITAL_COMMUNITY): Payer: Self-pay

## 2013-05-11 DIAGNOSIS — Z01812 Encounter for preprocedural laboratory examination: Secondary | ICD-10-CM | POA: Insufficient documentation

## 2013-05-11 DIAGNOSIS — K3580 Unspecified acute appendicitis: Principal | ICD-10-CM | POA: Insufficient documentation

## 2013-05-11 HISTORY — DX: Pneumonia, unspecified organism: J18.9

## 2013-05-11 HISTORY — DX: Headache: R51

## 2013-05-11 HISTORY — DX: Gastro-esophageal reflux disease without esophagitis: K21.9

## 2013-05-11 LAB — CBC WITH DIFFERENTIAL/PLATELET
Eosinophils Absolute: 0.5 10*3/uL (ref 0.0–0.7)
Eosinophils Relative: 3 % (ref 0–5)
HCT: 47.7 % (ref 39.0–52.0)
Hemoglobin: 16.6 g/dL (ref 13.0–17.0)
Lymphs Abs: 4.5 10*3/uL — ABNORMAL HIGH (ref 0.7–4.0)
MCH: 30.1 pg (ref 26.0–34.0)
MCV: 86.4 fL (ref 78.0–100.0)
Monocytes Absolute: 1.2 10*3/uL — ABNORMAL HIGH (ref 0.1–1.0)
Monocytes Relative: 6 % (ref 3–12)
Platelets: 284 10*3/uL (ref 150–400)
RBC: 5.52 MIL/uL (ref 4.22–5.81)

## 2013-05-11 LAB — COMPREHENSIVE METABOLIC PANEL
AST: 21 U/L (ref 0–37)
Albumin: 4.3 g/dL (ref 3.5–5.2)
Chloride: 100 mEq/L (ref 96–112)
Creatinine, Ser: 1.07 mg/dL (ref 0.50–1.35)
Total Bilirubin: 0.4 mg/dL (ref 0.3–1.2)

## 2013-05-11 LAB — URINALYSIS, ROUTINE W REFLEX MICROSCOPIC
Bilirubin Urine: NEGATIVE
Glucose, UA: NEGATIVE mg/dL
Hgb urine dipstick: NEGATIVE
Ketones, ur: NEGATIVE mg/dL
pH: 7.5 (ref 5.0–8.0)

## 2013-05-11 MED ORDER — IOHEXOL 300 MG/ML  SOLN
100.0000 mL | Freq: Once | INTRAMUSCULAR | Status: AC | PRN
  Administered 2013-05-11: 100 mL via INTRAVENOUS

## 2013-05-11 MED ORDER — SODIUM CHLORIDE 0.9 % IV SOLN
3.0000 g | Freq: Four times a day (QID) | INTRAVENOUS | Status: DC
  Administered 2013-05-12 (×2): 3 g via INTRAVENOUS
  Filled 2013-05-11 (×7): qty 3

## 2013-05-11 MED ORDER — GI COCKTAIL ~~LOC~~
30.0000 mL | Freq: Once | ORAL | Status: AC
  Administered 2013-05-11: 30 mL via ORAL
  Filled 2013-05-11: qty 30

## 2013-05-11 MED ORDER — SODIUM CHLORIDE 0.9 % IV SOLN
3.0000 g | Freq: Once | INTRAVENOUS | Status: AC
  Administered 2013-05-11: 3 g via INTRAVENOUS
  Filled 2013-05-11: qty 3

## 2013-05-11 MED ORDER — ONDANSETRON HCL 4 MG/2ML IJ SOLN
4.0000 mg | Freq: Once | INTRAMUSCULAR | Status: AC
  Administered 2013-05-11: 4 mg via INTRAVENOUS
  Filled 2013-05-11: qty 2

## 2013-05-11 MED ORDER — SODIUM CHLORIDE 0.9 % IV SOLN
INTRAVENOUS | Status: DC
  Administered 2013-05-11: via INTRAVENOUS

## 2013-05-11 MED ORDER — MORPHINE SULFATE 4 MG/ML IJ SOLN
4.0000 mg | Freq: Once | INTRAMUSCULAR | Status: AC
  Administered 2013-05-11: 4 mg via INTRAVENOUS
  Filled 2013-05-11: qty 1

## 2013-05-11 MED ORDER — IOHEXOL 300 MG/ML  SOLN
50.0000 mL | Freq: Once | INTRAMUSCULAR | Status: AC | PRN
  Administered 2013-05-11: 50 mL via ORAL

## 2013-05-11 NOTE — ED Notes (Signed)
Patient is trying to contact family members but unable to remember their phone numbers.

## 2013-05-11 NOTE — ED Notes (Signed)
Sharp abd pain onset 8am. Good appetite, ate lunch.

## 2013-05-11 NOTE — ED Notes (Signed)
Dr Wickline at bedside,  

## 2013-05-11 NOTE — ED Notes (Signed)
Pt drinking second cup of contrast, comfort measures provided,

## 2013-05-11 NOTE — ED Notes (Signed)
Pt ambulatory to restroom, tolerated well.  

## 2013-05-11 NOTE — ED Notes (Signed)
Pt states that the pain is better states "I am still sore" soreness located in right lower abd area.

## 2013-05-11 NOTE — ED Provider Notes (Signed)
11:25 PM patient is resting comfortably and pain is well controlled. Spoke with Dr. Lovell Sheehan. Plan 23 hour observation MedSurg floor, IV antibiotics and IV fluids antiemetics, analgesics as needed, n.p.o. he will perform surgery in the morning. Results for orders placed during the hospital encounter of 05/11/13  COMPREHENSIVE METABOLIC PANEL      Result Value Range   Sodium 138  135 - 145 mEq/L   Potassium 4.2  3.5 - 5.1 mEq/L   Chloride 100  96 - 112 mEq/L   CO2 28  19 - 32 mEq/L   Glucose, Bld 87  70 - 99 mg/dL   BUN 14  6 - 23 mg/dL   Creatinine, Ser 1.61  0.50 - 1.35 mg/dL   Calcium 9.7  8.4 - 09.6 mg/dL   Total Protein 8.3  6.0 - 8.3 g/dL   Albumin 4.3  3.5 - 5.2 g/dL   AST 21  0 - 37 U/L   ALT 29  0 - 53 U/L   Alkaline Phosphatase 86  39 - 117 U/L   Total Bilirubin 0.4  0.3 - 1.2 mg/dL   GFR calc non Af Amer 85 (*) >90 mL/min   GFR calc Af Amer >90  >90 mL/min  CBC WITH DIFFERENTIAL      Result Value Range   WBC 21.1 (*) 4.0 - 10.5 K/uL   RBC 5.52  4.22 - 5.81 MIL/uL   Hemoglobin 16.6  13.0 - 17.0 g/dL   HCT 04.5  40.9 - 81.1 %   MCV 86.4  78.0 - 100.0 fL   MCH 30.1  26.0 - 34.0 pg   MCHC 34.8  30.0 - 36.0 g/dL   RDW 91.4  78.2 - 95.6 %   Platelets 284  150 - 400 K/uL   Neutrophils Relative % 70  43 - 77 %   Neutro Abs 14.8 (*) 1.7 - 7.7 K/uL   Lymphocytes Relative 21  12 - 46 %   Lymphs Abs 4.5 (*) 0.7 - 4.0 K/uL   Monocytes Relative 6  3 - 12 %   Monocytes Absolute 1.2 (*) 0.1 - 1.0 K/uL   Eosinophils Relative 3  0 - 5 %   Eosinophils Absolute 0.5  0.0 - 0.7 K/uL   Basophils Relative 0  0 - 1 %   Basophils Absolute 0.1  0.0 - 0.1 K/uL  LIPASE, BLOOD      Result Value Range   Lipase 33  11 - 59 U/L  URINALYSIS, ROUTINE W REFLEX MICROSCOPIC      Result Value Range   Color, Urine YELLOW  YELLOW   APPearance CLEAR  CLEAR   Specific Gravity, Urine 1.020  1.005 - 1.030   pH 7.5  5.0 - 8.0   Glucose, UA NEGATIVE  NEGATIVE mg/dL   Hgb urine dipstick NEGATIVE   NEGATIVE   Bilirubin Urine NEGATIVE  NEGATIVE   Ketones, ur NEGATIVE  NEGATIVE mg/dL   Protein, ur NEGATIVE  NEGATIVE mg/dL   Urobilinogen, UA 0.2  0.0 - 1.0 mg/dL   Nitrite NEGATIVE  NEGATIVE   Leukocytes, UA NEGATIVE  NEGATIVE   Ct Abdomen Pelvis W Contrast  05/11/2013   *RADIOLOGY REPORT*  Clinical Data: Epigastric abdominal pain  CT ABDOMEN AND PELVIS WITH CONTRAST  Technique:  Multidetector CT imaging of the abdomen and pelvis was performed following the standard protocol during bolus administration of intravenous contrast.  Contrast: 50mL OMNIPAQUE IOHEXOL 300 MG/ML  SOLN, OMNIPAQUE IOHEXOL 300 MG/ML  SOLN  Comparison: 11/04/2009  Findings: Minor basilar atelectasis.  Left base subpleural bleb noted, stable.  Normal heart size.  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  No pericardial pleural effusion.  Abdomen:  Liver, gallbladder, biliary system, pancreas, spleen, adrenal glands, kidneys are within normal limits for age and demonstrate no acute process.  Negative for bowel obstruction, dilatation, ileus, or free air.  No abdominal free fluid, fluid collection, hemorrhage, abscess or adenopathy.  Minor atherosclerosis of the aortic bifurcation.  No aneurysm.  Pelvis:  Fluid distended enhancing appendix noted with numerous appendicoliths compatible with appendicitis, no evidence of rupture.  Appendix is demonstrated on images 64 through 70. Negative for abscess or fluid collection.  No hemorrhage, hematoma, adenopathy, or inguinal abnormality.  No hernia.  Urinary bladder under distended.  Distal colon is collapsed.  Scattered minor diverticulosis evident.   Lower lumbar degenerative disc disease and mild facet arthropathy. This is most pronounced at L5 S1.  Transitional vertebral segment noted at S1.  IMPRESSION: Acute non ruptured appendicitis  Critical Value/emergent results were called by telephone at the time of interpretation on 05/11/2013 at 11:20 p.m. to Dr. Ethelda Chick, who  verbally acknowledged these results.   Original Report Authenticated By: Judie Petit. Miles Costain, M.D.   Dx acute appendicitis  Doug Sou, MD 05/11/13 2332

## 2013-05-11 NOTE — ED Notes (Signed)
Dr, Bebe Shaggy notified of pt's pain scale, no additional orders given

## 2013-05-11 NOTE — ED Provider Notes (Signed)
CSN: [id number]     Arrival date & time 05/11/13  1917 History     First MD Initiated Contact with Patient 05/11/13 1935     Chief Complaint  Patient presents with  . Abdominal Pain    Patient is a 42 y.o. male presenting with abdominal pain. The history is provided by the patient.  Abdominal Pain This is a new problem. The current episode started 6 to 12 hours ago. The problem occurs hourly. The problem has not changed since onset.Associated symptoms include abdominal pain. Pertinent negatives include no chest pain and no shortness of breath. Nothing aggravates the symptoms. Nothing relieves the symptoms. He has tried nothing for the symptoms.  pt reports he woke up and had sharp pain in his epigastric region.  It does not radiate No cp/sob No fever/vomiting No new diarrhea He does take NSAIDs chronically He denies recent ETOH use No h/o pancreatitis No urinary symptoms He was able to eat lunch He denies melena/bloody stools No weakness reported Past Medical History  Diagnosis Date  . Depression   . High cholesterol    History reviewed. No pertinent past surgical history. History reviewed. No pertinent family history. History  Substance Use Topics  . Smoking status: Never Smoker   . Smokeless tobacco: Not on file  . Alcohol Use: Yes     Comment: rarely    Review of Systems  Constitutional: Negative for fever.  Respiratory: Negative for shortness of breath.   Cardiovascular: Negative for chest pain.  Gastrointestinal: Positive for abdominal pain. Negative for blood in stool.  Genitourinary: Negative for dysuria and testicular pain.  Neurological: Negative for dizziness and weakness.  All other systems reviewed and are negative.    Allergies  Darvocet  Home Medications   Current Outpatient Rx  Name  Route  Sig  Dispense  Refill  . cyclobenzaprine (FLEXERIL) 10 MG tablet   Oral   Take 1 tablet (10 mg total) by mouth 3 (three) times daily as needed for muscle  spasms.   21 tablet   0   . naproxen (NAPROSYN) 500 MG tablet   Oral   Take 1 tablet (500 mg total) by mouth 2 (two) times daily with a meal.   20 tablet   0   . PARoxetine (PAXIL) 20 MG tablet   Oral   Take 20 mg by mouth every morning.          . pravastatin (PRAVACHOL) 40 MG tablet   Oral   Take 40 mg by mouth every morning.          . traMADol (ULTRAM) 50 MG tablet   Oral   Take 1 tablet (50 mg total) by mouth every 6 (six) hours as needed for pain.   15 tablet   0    BP 117/80  Pulse 71  Temp(Src) 97.6 F (36.4 C) (Oral)  Resp 20  Ht 6' (1.829 m)  Wt 174 lb (78.926 kg)  BMI 23.59 kg/m2  SpO2 100% Physical Exam CONSTITUTIONAL: Well developed/well nourished HEAD: Normocephalic/atraumatic EYES: EOMI/PERRL, conjunctiva pink, no icterus ENMT: Mucous membranes moist NECK: supple no meningeal signs SPINE:entire spine nontender CV: S1/S2 noted, no murmurs/rubs/gallops noted LUNGS: Lungs are clear to auscultation bilaterally, no apparent distress ABDOMEN: soft, mild epigastric tenderness, no rebound or guarding No RUQ tenderness GU:no cva tenderness, no inguinal hernia/scrotal tenderness, chaperone present NEURO: Pt is awake/alert, moves all extremitiesx4 EXTREMITIES: pulses normal, full ROM SKIN: warm, color normal PSYCH: no abnormalities of mood noted  ED Course   Procedures  9:47 PM Pt now with worsening abd pain, now localizing to RLQ Will obtain labs/ct imaging Signed out to dr Ethelda Chick pending labs/imaging  MDM  Nursing notes including past medical history and social history reviewed and considered in documentation    Date: 05/11/2013 1945  Rate: 69  Rhythm: normal sinus rhythm  QRS Axis: normal  Intervals: normal  ST/T Wave abnormalities: normal  Conduction Disutrbances:none  Narrative Interpretation:   Old EKG Reviewed: unchanged from 02/24/2009     Joya Gaskins, MD 05/11/13 2148

## 2013-05-11 NOTE — ED Notes (Signed)
Pt c/o abd pain/epigastric pain that started this am, denies any n/v/d, states that he has had pain like this before, was seen by drs but never found out what was causing the pain. Pt describes the pain as sharp and burning. Has had problems with burping and indigestion in the past.

## 2013-05-12 ENCOUNTER — Observation Stay (HOSPITAL_COMMUNITY): Payer: Medicaid Other | Admitting: Anesthesiology

## 2013-05-12 ENCOUNTER — Encounter (HOSPITAL_COMMUNITY)
Admission: EM | Disposition: A | Discharge status: Home / Self Care | Payer: Self-pay | Source: Home / Self Care | Attending: Emergency Medicine

## 2013-05-12 ENCOUNTER — Encounter (HOSPITAL_COMMUNITY): Payer: Self-pay | Admitting: *Deleted

## 2013-05-12 ENCOUNTER — Encounter (HOSPITAL_COMMUNITY): Payer: Self-pay | Admitting: Anesthesiology

## 2013-05-12 HISTORY — PX: LAPAROSCOPIC APPENDECTOMY: SHX408

## 2013-05-12 LAB — SURGICAL PCR SCREEN
MRSA, PCR: NEGATIVE
Staphylococcus aureus: NEGATIVE

## 2013-05-12 SURGERY — APPENDECTOMY, LAPAROSCOPIC
Anesthesia: General | Site: Abdomen | Wound class: Contaminated

## 2013-05-12 MED ORDER — HYDROMORPHONE HCL PF 1 MG/ML IJ SOLN: 1.0000 mg | INTRAMUSCULAR | Status: DC | PRN

## 2013-05-12 MED ORDER — LIDOCAINE HCL (PF) 1 % IJ SOLN
INTRAMUSCULAR | Status: AC
  Filled 2013-05-12: qty 5

## 2013-05-12 MED ORDER — GLYCOPYRROLATE 0.2 MG/ML IJ SOLN
INTRAMUSCULAR | Status: DC | PRN
  Administered 2013-05-12: 0.6 mg via INTRAVENOUS

## 2013-05-12 MED ORDER — BUPIVACAINE HCL (PF) 0.5 % IJ SOLN
INTRAMUSCULAR | Status: DC | PRN
  Administered 2013-05-12: 10 mL

## 2013-05-12 MED ORDER — KETOROLAC TROMETHAMINE 30 MG/ML IJ SOLN
INTRAMUSCULAR | Status: AC
  Filled 2013-05-12: qty 1

## 2013-05-12 MED ORDER — OXYCODONE-ACETAMINOPHEN 5-325 MG PO TABS: 1.0000 | ORAL_TABLET | ORAL | Status: DC | PRN

## 2013-05-12 MED ORDER — ENOXAPARIN SODIUM 40 MG/0.4ML ~~LOC~~ SOLN
40.0000 mg | SUBCUTANEOUS | Status: DC
  Administered 2013-05-12: 40 mg via SUBCUTANEOUS
  Filled 2013-05-12: qty 0.4

## 2013-05-12 MED ORDER — ROCURONIUM BROMIDE 50 MG/5ML IV SOLN
INTRAVENOUS | Status: AC
  Filled 2013-05-12: qty 1

## 2013-05-12 MED ORDER — FENTANYL CITRATE 0.05 MG/ML IJ SOLN
INTRAMUSCULAR | Status: AC
  Filled 2013-05-12: qty 2

## 2013-05-12 MED ORDER — FENTANYL CITRATE 0.05 MG/ML IJ SOLN
INTRAMUSCULAR | Status: DC | PRN
  Administered 2013-05-12: 100 ug via INTRAVENOUS
  Administered 2013-05-12 (×3): 50 ug via INTRAVENOUS

## 2013-05-12 MED ORDER — ACETAMINOPHEN 500 MG PO TABS
1000.0000 mg | ORAL_TABLET | Freq: Four times a day (QID) | ORAL | Status: DC
  Administered 2013-05-12: 1000 mg via ORAL
  Filled 2013-05-12: qty 2

## 2013-05-12 MED ORDER — ONDANSETRON HCL 4 MG/2ML IJ SOLN
4.0000 mg | Freq: Once | INTRAMUSCULAR | Status: AC
  Administered 2013-05-12: 4 mg via INTRAVENOUS

## 2013-05-12 MED ORDER — PROPOFOL 10 MG/ML IV BOLUS
INTRAVENOUS | Status: DC | PRN
  Administered 2013-05-12: 150 mg via INTRAVENOUS

## 2013-05-12 MED ORDER — NEOSTIGMINE METHYLSULFATE 1 MG/ML IJ SOLN
INTRAMUSCULAR | Status: DC | PRN
  Administered 2013-05-12: 4 mg via INTRAVENOUS

## 2013-05-12 MED ORDER — SODIUM CHLORIDE 0.9 % IR SOLN
Status: DC | PRN
  Administered 2013-05-12: 500 mL

## 2013-05-12 MED ORDER — BUPIVACAINE HCL (PF) 0.5 % IJ SOLN
INTRAMUSCULAR | Status: AC
  Filled 2013-05-12: qty 30

## 2013-05-12 MED ORDER — FENTANYL CITRATE 0.05 MG/ML IJ SOLN
25.0000 ug | INTRAMUSCULAR | Status: DC | PRN
  Administered 2013-05-12 (×2): 50 ug via INTRAVENOUS

## 2013-05-12 MED ORDER — MIDAZOLAM HCL 2 MG/2ML IJ SOLN
INTRAMUSCULAR | Status: AC
  Filled 2013-05-12: qty 2

## 2013-05-12 MED ORDER — ROCURONIUM BROMIDE 100 MG/10ML IV SOLN
INTRAVENOUS | Status: DC | PRN
  Administered 2013-05-12: 25 mg via INTRAVENOUS

## 2013-05-12 MED ORDER — GLYCOPYRROLATE 0.2 MG/ML IJ SOLN
INTRAMUSCULAR | Status: AC
  Filled 2013-05-12: qty 1

## 2013-05-12 MED ORDER — GLYCOPYRROLATE 0.2 MG/ML IJ SOLN
INTRAMUSCULAR | Status: AC
  Filled 2013-05-12: qty 3

## 2013-05-12 MED ORDER — FENTANYL CITRATE 0.05 MG/ML IJ SOLN
INTRAMUSCULAR | Status: AC
  Filled 2013-05-12: qty 5

## 2013-05-12 MED ORDER — SUCCINYLCHOLINE CHLORIDE 20 MG/ML IJ SOLN
INTRAMUSCULAR | Status: DC | PRN
  Administered 2013-05-12: 120 mg via INTRAVENOUS

## 2013-05-12 MED ORDER — CHLORHEXIDINE GLUCONATE 4 % EX LIQD: 1.0000 "application " | Freq: Once | CUTANEOUS | Status: DC

## 2013-05-12 MED ORDER — PROPOFOL 10 MG/ML IV EMUL
INTRAVENOUS | Status: AC
  Filled 2013-05-12: qty 20

## 2013-05-12 MED ORDER — ENOXAPARIN SODIUM 40 MG/0.4ML ~~LOC~~ SOLN: 40.0000 mg | SUBCUTANEOUS | Status: DC

## 2013-05-12 MED ORDER — LACTATED RINGERS IV SOLN
INTRAVENOUS | Status: DC | PRN
  Administered 2013-05-12 (×2): via INTRAVENOUS

## 2013-05-12 MED ORDER — GLYCOPYRROLATE 0.2 MG/ML IJ SOLN
0.2000 mg | Freq: Once | INTRAMUSCULAR | Status: AC
  Administered 2013-05-12: 0.2 mg via INTRAVENOUS

## 2013-05-12 MED ORDER — ONDANSETRON HCL 4 MG PO TABS: 4.0000 mg | ORAL_TABLET | Freq: Four times a day (QID) | ORAL | Status: DC | PRN

## 2013-05-12 MED ORDER — ONDANSETRON HCL 4 MG/2ML IJ SOLN: 4.0000 mg | Freq: Three times a day (TID) | INTRAMUSCULAR | Status: DC | PRN

## 2013-05-12 MED ORDER — ONDANSETRON HCL 4 MG/2ML IJ SOLN: 4.0000 mg | Freq: Four times a day (QID) | INTRAMUSCULAR | Status: DC | PRN

## 2013-05-12 MED ORDER — OXYCODONE-ACETAMINOPHEN 7.5-325 MG PO TABS: 1.0000 | ORAL_TABLET | ORAL | Status: DC | PRN

## 2013-05-12 MED ORDER — SUCCINYLCHOLINE CHLORIDE 20 MG/ML IJ SOLN
INTRAMUSCULAR | Status: AC
Start: 2013-05-12 — End: 2013-05-12
  Filled 2013-05-12: qty 1

## 2013-05-12 MED ORDER — KETOROLAC TROMETHAMINE 30 MG/ML IJ SOLN
30.0000 mg | Freq: Once | INTRAMUSCULAR | Status: AC
  Administered 2013-05-12: 30 mg via INTRAVENOUS

## 2013-05-12 MED ORDER — LACTATED RINGERS IV SOLN
INTRAVENOUS | Status: DC
  Administered 2013-05-12: 13:00:00 via INTRAVENOUS

## 2013-05-12 MED ORDER — SODIUM CHLORIDE 0.9 % IV SOLN: INTRAVENOUS | Status: DC

## 2013-05-12 MED ORDER — MIDAZOLAM HCL 2 MG/2ML IJ SOLN
1.0000 mg | INTRAMUSCULAR | Status: DC | PRN
  Administered 2013-05-12 (×2): 2 mg via INTRAVENOUS

## 2013-05-12 MED ORDER — ONDANSETRON HCL 4 MG/2ML IJ SOLN
INTRAMUSCULAR | Status: AC
  Filled 2013-05-12: qty 2

## 2013-05-12 MED ORDER — ENOXAPARIN SODIUM 40 MG/0.4ML ~~LOC~~ SOLN: 40.0000 mg | Freq: Once | SUBCUTANEOUS | Status: DC

## 2013-05-12 MED ORDER — LIDOCAINE HCL (CARDIAC) 20 MG/ML IV SOLN
INTRAVENOUS | Status: DC | PRN
  Administered 2013-05-12: 50 mg via INTRAVENOUS

## 2013-05-12 MED ORDER — LACTATED RINGERS IV SOLN
INTRAVENOUS | Status: DC
  Administered 2013-05-12: 09:00:00 via INTRAVENOUS

## 2013-05-12 MED ORDER — ONDANSETRON HCL 4 MG/2ML IJ SOLN: 4.0000 mg | Freq: Once | INTRAMUSCULAR | Status: DC | PRN

## 2013-05-12 SURGICAL SUPPLY — 45 items
BAG HAMPER (MISCELLANEOUS) ×2 IMPLANT
CLOTH BEACON ORANGE TIMEOUT ST (SAFETY) ×2 IMPLANT
COVER LIGHT HANDLE STERIS (MISCELLANEOUS) ×4 IMPLANT
CUTTER FLEX LINEAR 45M (STAPLE) IMPLANT
CUTTER LINEAR ENDO 35 ETS (STAPLE) IMPLANT
CUTTER LINEAR ENDO 35 ETS TH (STAPLE) ×2 IMPLANT
DECANTER SPIKE VIAL GLASS SM (MISCELLANEOUS) ×2 IMPLANT
DISSECTOR BLUNT TIP ENDO 5MM (MISCELLANEOUS) IMPLANT
DURAPREP 26ML APPLICATOR (WOUND CARE) ×2 IMPLANT
ELECT REM PT RETURN 9FT ADLT (ELECTROSURGICAL) ×2
ELECTRODE REM PT RTRN 9FT ADLT (ELECTROSURGICAL) ×1 IMPLANT
FILTER SMOKE EVAC LAPAROSHD (FILTER) ×2 IMPLANT
FORMALIN 10 PREFIL 120ML (MISCELLANEOUS) ×2 IMPLANT
GLOVE BIO SURGEON STRL SZ7.5 (GLOVE) ×2 IMPLANT
GLOVE EXAM NITRILE MD LF STRL (GLOVE) ×2 IMPLANT
GLOVE INDICATOR 7.0 STRL GRN (GLOVE) ×2 IMPLANT
GLOVE SS BIOGEL STRL SZ 6.5 (GLOVE) ×1 IMPLANT
GLOVE SUPERSENSE BIOGEL SZ 6.5 (GLOVE) ×1
GOWN STRL REIN XL XLG (GOWN DISPOSABLE) ×4 IMPLANT
INST SET LAPROSCOPIC AP (KITS) ×2 IMPLANT
IV NS IRRIG 3000ML ARTHROMATIC (IV SOLUTION) IMPLANT
KIT ROOM TURNOVER APOR (KITS) ×2 IMPLANT
MANIFOLD NEPTUNE II (INSTRUMENTS) ×2 IMPLANT
NEEDLE INSUFFLATION 14GA 120MM (NEEDLE) ×2 IMPLANT
NS IRRIG 1000ML POUR BTL (IV SOLUTION) ×2 IMPLANT
PACK LAP CHOLE LZT030E (CUSTOM PROCEDURE TRAY) ×2 IMPLANT
PAD ARMBOARD 7.5X6 YLW CONV (MISCELLANEOUS) ×2 IMPLANT
POUCH SPECIMEN RETRIEVAL 10MM (ENDOMECHANICALS) ×2 IMPLANT
RELOAD /EVU35 (ENDOMECHANICALS) IMPLANT
RELOAD 45 VASCULAR/THIN (ENDOMECHANICALS) IMPLANT
RELOAD CUTTER ETS 35MM STAND (ENDOMECHANICALS) IMPLANT
SCALPEL HARMONIC ACE (MISCELLANEOUS) ×2 IMPLANT
SET BASIN LINEN APH (SET/KITS/TRAYS/PACK) ×2 IMPLANT
SET TUBE IRRIG SUCTION NO TIP (IRRIGATION / IRRIGATOR) IMPLANT
SPONGE GAUZE 2X2 8PLY STRL LF (GAUZE/BANDAGES/DRESSINGS) ×6 IMPLANT
STAPLER VISISTAT (STAPLE) ×2 IMPLANT
SUT VICRYL 0 UR6 27IN ABS (SUTURE) ×2 IMPLANT
TAPE CLOTH SURG 4X10 WHT LF (GAUZE/BANDAGES/DRESSINGS) ×2 IMPLANT
TRAY FOLEY CATH 16FR SILVER (SET/KITS/TRAYS/PACK) ×2 IMPLANT
TROCAR Z-THAD FIOS HNDL 12X100 (TROCAR) ×2 IMPLANT
TROCAR Z-THRD FIOS HNDL 11X100 (TROCAR) ×2 IMPLANT
TROCAR Z-THREAD FIOS 5X100MM (TROCAR) ×2 IMPLANT
TUBING INSUFFLATION (TUBING) ×2 IMPLANT
WARMER LAPAROSCOPE (MISCELLANEOUS) ×2 IMPLANT
YANKAUER SUCT 12FT TUBE ARGYLE (SUCTIONS) ×2 IMPLANT

## 2013-05-12 NOTE — Anesthesia Procedure Notes (Signed)
Procedure Name: Intubation Date/Time: 05/12/2013 11:29 AM Performed by: Carolyne Littles, AMY L Pre-anesthesia Checklist: Patient identified, Patient being monitored, Timeout performed, Emergency Drugs available and Suction available Patient Re-evaluated:Patient Re-evaluated prior to inductionOxygen Delivery Method: Circle System Utilized Preoxygenation: Pre-oxygenation with 100% oxygen Intubation Type: IV induction, Rapid sequence and Cricoid Pressure applied Laryngoscope Size: 3 and Miller Grade View: Grade I Tube type: Oral Tube size: 7.0 mm Number of attempts: 1 Airway Equipment and Method: stylet Placement Confirmation: ETT inserted through vocal cords under direct vision,  positive ETCO2 and breath sounds checked- equal and bilateral Secured at: 21 cm Tube secured with: Tape Dental Injury: Teeth and Oropharynx as per pre-operative assessment

## 2013-05-12 NOTE — Anesthesia Preprocedure Evaluation (Addendum)
Anesthesia Evaluation  Patient identified by MRN, date of birth, ID band Patient awake    Reviewed: Allergy & Precautions, H&P , NPO status , Patient's Chart, lab work & pertinent test results  Airway Mallampati: I TM Distance: >3 FB     Dental  (+) Edentulous Upper and Edentulous Lower   Pulmonary pneumonia -, resolved,  breath sounds clear to auscultation        Cardiovascular negative cardio ROS  Rhythm:Regular Rate:Normal     Neuro/Psych  Headaches, PSYCHIATRIC DISORDERS Depression    GI/Hepatic GERD-  Controlled and Medicated,  Endo/Other    Renal/GU      Musculoskeletal   Abdominal   Peds  Hematology   Anesthesia Other Findings   Reproductive/Obstetrics                          Anesthesia Physical Anesthesia Plan  ASA: II  Anesthesia Plan: General   Post-op Pain Management:    Induction: Intravenous, Rapid sequence and Cricoid pressure planned  Airway Management Planned: Oral ETT  Additional Equipment:   Intra-op Plan:   Post-operative Plan: Extubation in OR  Informed Consent: I have reviewed the patients History and Physical, chart, labs and discussed the procedure including the risks, benefits and alternatives for the proposed anesthesia with the patient or authorized representative who has indicated his/her understanding and acceptance.     Plan Discussed with:   Anesthesia Plan Comments:         Anesthesia Quick Evaluation

## 2013-05-12 NOTE — Preoperative (Signed)
Beta Blockers   Reason not to administer Beta Blockers:Not Applicable 

## 2013-05-12 NOTE — ED Notes (Signed)
Report given to [facility] unit 300

## 2013-05-12 NOTE — Progress Notes (Signed)
Pt came up from OR today. Pt is in NAD and has been discharged home today. Pt is going to call a friend for a ride.Pt is in NAD, IV is out, all paperwork has been reviewed/discussed with patient, and there are no questions/concerns at this time.Pt is to be accompanied downstairs by staff and family via wheelchair.

## 2013-05-12 NOTE — Progress Notes (Signed)
Pt received his daily dose of lovenox injection at 0758 this morning. Recorded on MAR. Will continue to monitor.

## 2013-05-12 NOTE — Anesthesia Postprocedure Evaluation (Signed)
  Anesthesia Post-op Note  Patient: Javier Cross  Procedure(s) Performed: Procedure(s): APPENDECTOMY LAPAROSCOPIC (N/A)  Patient Location: PACU  Anesthesia Type:General  Level of Consciousness: sedated and patient cooperative  Airway and Oxygen Therapy: Patient Spontanous Breathing and Patient connected to face mask oxygen  Post-op Pain: mild  Post-op Assessment: Post-op Vital signs reviewed, Patient's Cardiovascular Status Stable, Respiratory Function Stable, Patent Airway, No signs of Nausea or vomiting and Pain level controlled  Post-op Vital Signs: Reviewed and stable  Complications: No apparent anesthesia complications

## 2013-05-12 NOTE — Op Note (Signed)
Patient:  Javier Cross  DOB:  1971-06-04  MRN:  [id number]   Preop Diagnosis:  Acute appendicitis  Postop Diagnosis:  Same  Procedure:  Laparoscopic appendectomy  Surgeon:  Franky Macho, M.D.  Anes:  General endotracheal  Indications:  Patient is a 42 year old white male who presents with a less than 24-hour history of worsening right lower quadrant abdominal pain. CT scan of the abdomen reveals acute appendicitis without perforation. The risks and benefits of the procedure including bleeding, infection, and the possibility of an open procedure were fully explained to the patient, who gave informed consent.  Procedure note:  Patient is placed the supine position. After induction of general endotracheal anesthesia, the abdomen was prepped and draped using usual sterile technique with DuraPrep. Surgical site confirmation was performed.  A supraumbilical incision was made down to the fascia. A Veress needle was introduced into the abdominal cavity and confirmation of placement was done using the saline drop test. The abdomen was then insufflated to 16 mm mercury pressure. An 11 mm trocar was introduced into the abdominal cavity under direct visualization without difficulty. The patient was placed in deeper Trendelenburg position and an additional 12 mm trocar was placed the suprapubic region and a 5 mm trocar was placed left lower quadrant region. The appendix was visualized and noted to be diffusely inflamed and enlarged. There was no evidence of perforation. The mesoappendix was divided using the harmonic scalpel. A standard Endo GIA was placed across the base the appendix and fired. The appendix was then removed using an Endo Catch bag. The staple line was inspected and noted within normal limits. All fluid and air were then evacuated from the abdominal cavity prior to removal of the trochars.  All wounds were irrigated normal saline. All wounds were checked with 0.5% Sensorcaine. The  supraumbilical fascia as well as suprapubic fascia were reapproximated using 0 Vicryl interrupted sutures. All skin incisions were closed using staples. Betadine ointment and dry sterile dressings were applied.  All tape and needle counts were correct at the end of the procedure. Patient was extubated in the operating room and transferred to PACU in stable condition.  Complications:  None  EBL:  Minimal  Specimen:  Appendix

## 2013-05-12 NOTE — Transfer of Care (Signed)
Immediate Anesthesia Transfer of Care Note  Patient: Javier Cross  Procedure(s) Performed: Procedure(s): APPENDECTOMY LAPAROSCOPIC (N/A)  Patient Location: PACU  Anesthesia Type:General  Level of Consciousness: sedated and patient cooperative  Airway & Oxygen Therapy: Patient Spontanous Breathing and Patient connected to face mask oxygen  Post-op Assessment: Report given to PACU RN and Post -op Vital signs reviewed and stable  Post vital signs: Reviewed and stable  Complications: No apparent anesthesia complications

## 2013-05-12 NOTE — OR Nursing (Signed)
Pt. Stated he got a lovenox injection this am around 0800. Confirmed with his nurse Francena Hanly. Was not documented on [facility]

## 2013-05-12 NOTE — H&P (Signed)
Javier Cross is an 42 y.o. male.   Chief Complaint: Abdominal pain HPI: Patient is a 42 year old white male who yesterday afternoon developed right-sided abdominal pain. He presented emergency room and was found on CT scan the abdomen to have acute appendicitis without evidence of perforation. Was also noted to have leukocytosis. He was admitted to the hospital for IV antibiotics and surgical intervention.  Past Medical History  Diagnosis Date  . Depression   . High cholesterol     History reviewed. No pertinent past surgical history.  History reviewed. No pertinent family history. Social History:  reports that he has never smoked. He does not have any smokeless tobacco history on file. He reports that  drinks alcohol. He reports that he does not use illicit drugs.  Allergies:  Allergies  Allergen Reactions  . Darvocet (Propoxyphene-Acetaminophen) Nausea And Vomiting    Medications Prior to Admission  Medication Sig Dispense Refill  . acetaminophen (TYLENOL) 500 MG tablet Take 500 mg by mouth every 6 (six) hours as needed for pain.      . naproxen (NAPROSYN) 500 MG tablet Take 1 tablet (500 mg total) by mouth 2 (two) times daily with a meal.  20 tablet  0  . PARoxetine (PAXIL) 40 MG tablet Take 40 mg by mouth every morning.      . pravastatin (PRAVACHOL) 40 MG tablet Take 40 mg by mouth every morning.         Results for orders placed during the hospital encounter of 05/11/13 (from the past 48 hour(s))  COMPREHENSIVE METABOLIC PANEL     Status: Abnormal   Collection Time    05/11/13  9:02 PM      Result Value Range   Sodium 138  135 - 145 mEq/L   Potassium 4.2  3.5 - 5.1 mEq/L   Chloride 100  96 - 112 mEq/L   CO2 28  19 - 32 mEq/L   Glucose, Bld 87  70 - 99 mg/dL   BUN 14  6 - 23 mg/dL   Creatinine, Ser 1.61  0.50 - 1.35 mg/dL   Calcium 9.7  8.4 - 09.6 mg/dL   Total Protein 8.3  6.0 - 8.3 g/dL   Albumin 4.3  3.5 - 5.2 g/dL   AST 21  0 - 37 U/L   ALT 29  0 - 53 U/L    Alkaline Phosphatase 86  39 - 117 U/L   Total Bilirubin 0.4  0.3 - 1.2 mg/dL   GFR calc non Af Amer 85 (*) >90 mL/min   GFR calc Af Amer >90  >90 mL/min   Comment:            The eGFR has been calculated     using the CKD EPI equation.     This calculation has not been     validated in all clinical     situations.     eGFR's persistently     <90 mL/min signify     possible Chronic Kidney Disease.  CBC WITH DIFFERENTIAL     Status: Abnormal   Collection Time    05/11/13  9:02 PM      Result Value Range   WBC 21.1 (*) 4.0 - 10.5 K/uL   RBC 5.52  4.22 - 5.81 MIL/uL   Hemoglobin 16.6  13.0 - 17.0 g/dL   HCT 04.5  40.9 - 81.1 %   MCV 86.4  78.0 - 100.0 fL   MCH 30.1  26.0 - 34.0  pg   MCHC 34.8  30.0 - 36.0 g/dL   RDW 60.4  54.0 - 98.1 %   Platelets 284  150 - 400 K/uL   Neutrophils Relative % 70  43 - 77 %   Neutro Abs 14.8 (*) 1.7 - 7.7 K/uL   Lymphocytes Relative 21  12 - 46 %   Lymphs Abs 4.5 (*) 0.7 - 4.0 K/uL   Monocytes Relative 6  3 - 12 %   Monocytes Absolute 1.2 (*) 0.1 - 1.0 K/uL   Eosinophils Relative 3  0 - 5 %   Eosinophils Absolute 0.5  0.0 - 0.7 K/uL   Basophils Relative 0  0 - 1 %   Basophils Absolute 0.1  0.0 - 0.1 K/uL  LIPASE, BLOOD     Status: None   Collection Time    05/11/13  9:02 PM      Result Value Range   Lipase 33  11 - 59 U/L  URINALYSIS, ROUTINE W REFLEX MICROSCOPIC     Status: None   Collection Time    05/11/13 10:30 PM      Result Value Range   Color, Urine YELLOW  YELLOW   APPearance CLEAR  CLEAR   Specific Gravity, Urine 1.020  1.005 - 1.030   pH 7.5  5.0 - 8.0   Glucose, UA NEGATIVE  NEGATIVE mg/dL   Hgb urine dipstick NEGATIVE  NEGATIVE   Bilirubin Urine NEGATIVE  NEGATIVE   Ketones, ur NEGATIVE  NEGATIVE mg/dL   Protein, ur NEGATIVE  NEGATIVE mg/dL   Urobilinogen, UA 0.2  0.0 - 1.0 mg/dL   Nitrite NEGATIVE  NEGATIVE   Leukocytes, UA NEGATIVE  NEGATIVE   Comment: MICROSCOPIC NOT DONE ON URINES WITH NEGATIVE PROTEIN, BLOOD,  LEUKOCYTES, NITRITE, OR GLUCOSE <1000 mg/dL.   Ct Abdomen Pelvis W Contrast  05/11/2013   *RADIOLOGY REPORT*  Clinical Data: Epigastric abdominal pain  CT ABDOMEN AND PELVIS WITH CONTRAST  Technique:  Multidetector CT imaging of the abdomen and pelvis was performed following the standard protocol during bolus administration of intravenous contrast.  Contrast: 50mL OMNIPAQUE IOHEXOL 300 MG/ML  SOLN, OMNIPAQUE IOHEXOL 300 MG/ML  SOLN  Comparison: 11/04/2009  Findings: Minor basilar atelectasis.  Left base subpleural bleb noted, stable.  Normal heart size.  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  No pericardial pleural effusion.  Abdomen:  Liver, gallbladder, biliary system, pancreas, spleen, adrenal glands, kidneys are within normal limits for age and demonstrate no acute process.  Negative for bowel obstruction, dilatation, ileus, or free air.  No abdominal free fluid, fluid collection, hemorrhage, abscess or adenopathy.  Minor atherosclerosis of the aortic bifurcation.  No aneurysm.  Pelvis:  Fluid distended enhancing appendix noted with numerous appendicoliths compatible with appendicitis, no evidence of rupture.  Appendix is demonstrated on images 64 through 70. Negative for abscess or fluid collection.  No hemorrhage, hematoma, adenopathy, or inguinal abnormality.  No hernia.  Urinary bladder under distended.  Distal colon is collapsed.  Scattered minor diverticulosis evident.   Lower lumbar degenerative disc disease and mild facet arthropathy. This is most pronounced at L5 S1.  Transitional vertebral segment noted at S1.  IMPRESSION: Acute non ruptured appendicitis  Critical Value/emergent results were called by telephone at the time of interpretation on 05/11/2013 at 11:20 p.m. to Dr. Ethelda Chick, who verbally acknowledged these results.   Original Report Authenticated By: Judie Petit. Miles Costain, M.D.    Review of Systems  Constitutional: Positive for malaise/fatigue.  HENT: Negative.  Gastrointestinal: Positive for nausea and abdominal pain.  Skin: Negative.   All other systems reviewed and are negative.    Blood pressure 98/54, pulse 50, temperature 97.9 F (36.6 C), temperature source Oral, resp. rate 20, height 6' (1.829 m), weight 77.7 kg (171 lb 4.8 oz), SpO2 98.00%. Physical Exam  Constitutional: He is oriented to person, place, and time. He appears well-developed and well-nourished.  HENT:  Head: Normocephalic and atraumatic.  Neck: Normal range of motion. Neck supple.  Cardiovascular: Normal rate, regular rhythm and normal heart sounds.   Respiratory: Effort normal and breath sounds normal.  GI: Soft. Bowel sounds are normal. He exhibits no distension and no mass. There is tenderness. There is no guarding.  Tender in the right lower corner to deep palpation. No rigidity noted. No masses noted.  Neurological: He is alert and oriented to person, place, and time.  Skin: Skin is warm and dry.     Assessment/Plan Impression: Acute appendicitis Plan: The patient was started on Unasyn IV. He subsequently undergo laparoscopic appendectomy. The risks and benefits of the procedure including bleeding, infection, and the possibility of an open procedure were fully explained to the patient, who gave informed consent.  Dakwan Pridgen A 05/12/2013, 7:47 AM

## 2013-05-13 ENCOUNTER — Encounter (HOSPITAL_COMMUNITY): Payer: Self-pay | Admitting: General Surgery

## 2013-05-13 NOTE — Discharge Summary (Signed)
Physician Discharge Summary  Patient ID: Javier Cross MRN: [id number] DOB/AGE: 11/08/1970 42 y.o.  Admit date: 05/11/2013 Discharge date: 05/12/2013 Admission Diagnoses: Acute appendicitis  Discharge Diagnoses: Acute appendicitis Active Problems:   * No active hospital problems. *   Discharged Condition: good  Hospital Course: Patient is a 42 year old white male who presented emergency room with a less than 24-hour history of worsening right lower quadrant abdominal pain. CT scan of the abdomen revealed acute appendicitis. He was admitted to the hospital for IV antibiotics. He subsequently underwent laparoscopic appendectomy on 05/12/2013. Tolerated procedure well. His postoperative course was unremarkable. His diet was advanced at difficulty. He was discharged on 05/12/2013 in good improving condition.  Treatments: surgery: Laparoscopic appendectomy on 05/12/2013  Discharge Exam: Blood pressure 111/67, pulse 63, temperature 97.2 F (36.2 C), temperature source Oral, resp. rate 16, height 6' (1.829 m), weight 77.565 kg (171 lb), SpO2 95.00%. General appearance: alert, cooperative and no distress Resp: clear to auscultation bilaterally Cardio: regular rate and rhythm, S1, S2 normal, no murmur, click, rub or gallop GI: Soft. Dressings intact.  Disposition: 01-Home or Self Care     Medication List    STOP taking these medications       acetaminophen 500 MG tablet  Commonly known as:  TYLENOL      TAKE these medications       naproxen 500 MG tablet  Commonly known as:  NAPROSYN  Take 1 tablet (500 mg total) by mouth 2 (two) times daily with a meal.     oxyCODONE-acetaminophen 7.5-325 MG per tablet  Commonly known as:  PERCOCET  Take 1-2 tablets by mouth every 4 (four) hours as needed for pain.     PARoxetine 40 MG tablet  Commonly known as:  PAXIL  Take 40 mg by mouth every morning.     pravastatin 40 MG tablet  Commonly known as:  PRAVACHOL  Take 40 mg by mouth  every morning.           Follow-up Information   Follow up with Dalia Heading, MD. Schedule an appointment as soon as possible for a visit on 05/21/2013.   Contact information:   [address] [place] [address] [phone]       Signed: Franky Macho A 05/13/2013, 8:39 AM

## 2013-08-26 ENCOUNTER — Emergency Department (HOSPITAL_COMMUNITY)
Admission: EM | Admit: 2013-08-26 | Discharge: 2013-08-26 | Disposition: A | Discharge status: Home / Self Care | Payer: Medicaid Other | Attending: Emergency Medicine | Admitting: Emergency Medicine

## 2013-08-26 ENCOUNTER — Encounter (HOSPITAL_COMMUNITY): Payer: Self-pay | Admitting: Emergency Medicine

## 2013-08-26 DIAGNOSIS — G8929 Other chronic pain: Secondary | ICD-10-CM | POA: Insufficient documentation

## 2013-08-26 DIAGNOSIS — M545 Low back pain, unspecified: Secondary | ICD-10-CM | POA: Insufficient documentation

## 2013-08-26 DIAGNOSIS — F329 Major depressive disorder, single episode, unspecified: Secondary | ICD-10-CM | POA: Insufficient documentation

## 2013-08-26 DIAGNOSIS — Z79899 Other long term (current) drug therapy: Secondary | ICD-10-CM | POA: Insufficient documentation

## 2013-08-26 DIAGNOSIS — Z87891 Personal history of nicotine dependence: Secondary | ICD-10-CM | POA: Insufficient documentation

## 2013-08-26 DIAGNOSIS — Z8679 Personal history of other diseases of the circulatory system: Secondary | ICD-10-CM | POA: Insufficient documentation

## 2013-08-26 DIAGNOSIS — Z8719 Personal history of other diseases of the digestive system: Secondary | ICD-10-CM | POA: Insufficient documentation

## 2013-08-26 DIAGNOSIS — Z8701 Personal history of pneumonia (recurrent): Secondary | ICD-10-CM | POA: Insufficient documentation

## 2013-08-26 DIAGNOSIS — E7800 Pure hypercholesterolemia, unspecified: Secondary | ICD-10-CM | POA: Insufficient documentation

## 2013-08-26 DIAGNOSIS — R52 Pain, unspecified: Secondary | ICD-10-CM | POA: Insufficient documentation

## 2013-08-26 DIAGNOSIS — F3289 Other specified depressive episodes: Secondary | ICD-10-CM | POA: Insufficient documentation

## 2013-08-26 MED ORDER — PREDNISONE 50 MG PO TABS
60.0000 mg | ORAL_TABLET | Freq: Once | ORAL | Status: AC
  Administered 2013-08-26: 60 mg via ORAL
  Filled 2013-08-26 (×2): qty 1

## 2013-08-26 MED ORDER — TRAMADOL HCL 50 MG PO TABS: 50.0000 mg | ORAL_TABLET | Freq: Four times a day (QID) | ORAL | Status: DC | PRN

## 2013-08-26 MED ORDER — PREDNISONE 10 MG PO TABS: ORAL_TABLET | ORAL | Status: DC

## 2013-08-26 MED ORDER — TRAMADOL HCL 50 MG PO TABS
50.0000 mg | ORAL_TABLET | Freq: Once | ORAL | Status: AC
  Administered 2013-08-26: 50 mg via ORAL
  Filled 2013-08-26: qty 1

## 2013-08-26 NOTE — ED Provider Notes (Signed)
CSN: [id number]     Arrival date & time 08/26/13  1603 History   First MD Initiated Contact with Patient 08/26/13 1613     Chief Complaint  Patient presents with  . Back Pain   (Consider location/radiation/quality/duration/timing/severity/associated sxs/prior Treatment) HPI Comments: Javier Cross is a 42 y.o. Male presenting with acute on chronic low back pain which has which has been worsened for the past several days which he blames on the cold weather denying any injury.  He has known scoliosis which causes chronic intermittent pain.   Patient denies any new injury specifically.  There is no radiation into the lower extremities.  There has been no weakness or numbness in the lower extremities and no urinary or bowel retention or incontinence.  Patient does not have a history of cancer or IVDU.  He is currently taking naproxen 500 mg bid which does not relieve his pain.  He sleeps with a heating pad which helps some until he gets up and moves about his day again.      The history is provided by the patient.    Past Medical History  Diagnosis Date  . Depression   . High cholesterol   . Pneumonia     2013 right lung  . GERD (gastroesophageal reflux disease)     with certain foods  . Headache(784.0)     regular and migraines   Past Surgical History  Procedure Laterality Date  . Laparoscopic appendectomy N/A 05/12/2013    Procedure: APPENDECTOMY LAPAROSCOPIC;  Surgeon: Dalia Heading, MD;  Location: [facility];  Service: General;  Laterality: N/A;   Family History  Problem Relation Age of Onset  . Hypertension Mother   . Hyperlipidemia Mother   . Hypertension Father   . Hyperlipidemia Father    History  Substance Use Topics  . Smoking status: Former Games developer  . Smokeless tobacco: Former Neurosurgeon    Types: Chew  . Alcohol Use: Yes     Comment: rarely, beer socially    Review of Systems  Constitutional: Negative for fever.  Respiratory: Negative for shortness of breath.    Cardiovascular: Negative for chest pain and leg swelling.  Gastrointestinal: Negative for abdominal pain, constipation and abdominal distention.  Genitourinary: Negative for dysuria, urgency, frequency, flank pain and difficulty urinating.  Musculoskeletal: Positive for back pain. Negative for gait problem and joint swelling.  Skin: Negative for rash.  Neurological: Negative for weakness and numbness.    Allergies  Darvocet  Home Medications   Current Outpatient Rx  Name  Route  Sig  Dispense  Refill  . LORazepam (ATIVAN) 1 MG tablet   Oral   Take 1 mg by mouth at bedtime.         . naproxen (NAPROSYN) 500 MG tablet   Oral   Take 1 tablet (500 mg total) by mouth 2 (two) times daily with a meal.   20 tablet   0   . PARoxetine (PAXIL) 40 MG tablet   Oral   Take 40 mg by mouth every morning.         . pravastatin (PRAVACHOL) 40 MG tablet   Oral   Take 40 mg by mouth every morning.          . predniSONE (DELTASONE) 10 MG tablet      6, 5, 4, 3, 2 then 1 tablet by mouth daily for 6 days total.   21 tablet   0   . traMADol (ULTRAM) 50 MG tablet  Oral   Take 1 tablet (50 mg total) by mouth every 6 (six) hours as needed.   30 tablet   0    BP 123/87  Pulse 88  Temp(Src) 97.6 F (36.4 C) (Oral)  Resp 18  Ht 6' (1.829 m)  Wt 174 lb (78.926 kg)  BMI 23.59 kg/m2  SpO2 98% Physical Exam  Nursing note and vitals reviewed. Constitutional: He appears well-developed and well-nourished.  HENT:  Head: Normocephalic.  Eyes: Conjunctivae are normal.  Neck: Normal range of motion. Neck supple.  Cardiovascular: Normal rate and intact distal pulses.   Pedal pulses normal.  Pulmonary/Chest: Effort normal.  Abdominal: Soft. Bowel sounds are normal. He exhibits no distension and no mass.  Musculoskeletal: Normal range of motion. He exhibits tenderness. He exhibits no edema.       Lumbar back: He exhibits bony tenderness. He exhibits no swelling, no edema and no spasm.   Midline tenderness without edema, no paralumbar tenderness.  Neurological: He is alert. He has normal strength. He displays no atrophy and no tremor. No sensory deficit. Gait normal.  Reflex Scores:      Patellar reflexes are 2+ on the right side and 2+ on the left side.      Achilles reflexes are 2+ on the right side and 2+ on the left side. No strength deficit noted in hip and knee flexor and extensor muscle groups.  Ankle flexion and extension intact.  Skin: Skin is warm and dry.  Psychiatric: He has a normal mood and affect.    ED Course  Procedures (including critical care time) Labs Review Labs Reviewed - No data to display Imaging Review No results found.  EKG Interpretation   None       MDM   1. Low back pain    Prednisone taper in place of naproxen,  Tramadol,  Heat.  F/u with pcp in 2 weeks (already scheduled).  No neuro deficit on exam or by history to suggest emergent or surgical presentation.  Also discussed worsened sx that should prompt immediate re-evaluation including distal weakness, bowel/bladder retention/incontinence.          Burgess Amor, PA-C 08/26/13 1659

## 2013-08-26 NOTE — ED Provider Notes (Signed)
Medical screening examination/treatment/procedure(s) were performed by non-physician practitioner and as supervising physician I was immediately available for consultation/collaboration.  Flint Melter, MD 08/26/13 [phone]

## 2013-08-26 NOTE — ED Notes (Signed)
Pt has chronic back pain, woke up with increased pain 2 days ago.

## 2013-10-26 ENCOUNTER — Other Ambulatory Visit (HOSPITAL_COMMUNITY): Payer: Self-pay | Admitting: Family Medicine

## 2013-10-26 ENCOUNTER — Ambulatory Visit (HOSPITAL_COMMUNITY)
Admission: RE | Admit: 2013-10-26 | Discharge: 2013-10-26 | Disposition: A | Discharge status: Home / Self Care | Payer: Medicaid Other | Source: Ambulatory Visit | Attending: Family Medicine | Admitting: Family Medicine

## 2013-10-26 DIAGNOSIS — M545 Low back pain, unspecified: Secondary | ICD-10-CM | POA: Insufficient documentation

## 2013-10-26 DIAGNOSIS — M47817 Spondylosis without myelopathy or radiculopathy, lumbosacral region: Secondary | ICD-10-CM | POA: Insufficient documentation

## 2013-10-26 DIAGNOSIS — M4120 Other idiopathic scoliosis, site unspecified: Secondary | ICD-10-CM | POA: Insufficient documentation

## 2013-10-26 DIAGNOSIS — G8929 Other chronic pain: Secondary | ICD-10-CM

## 2013-10-26 DIAGNOSIS — Q762 Congenital spondylolisthesis: Secondary | ICD-10-CM | POA: Insufficient documentation

## 2013-11-27 ENCOUNTER — Encounter (HOSPITAL_COMMUNITY): Payer: Self-pay | Admitting: Emergency Medicine

## 2013-11-27 ENCOUNTER — Emergency Department (HOSPITAL_COMMUNITY)
Admission: EM | Admit: 2013-11-27 | Discharge: 2013-11-28 | Disposition: A | Discharge status: Home / Self Care | Payer: Medicaid Other | Attending: Emergency Medicine | Admitting: Emergency Medicine

## 2013-11-27 DIAGNOSIS — Z79899 Other long term (current) drug therapy: Secondary | ICD-10-CM | POA: Insufficient documentation

## 2013-11-27 DIAGNOSIS — Y929 Unspecified place or not applicable: Secondary | ICD-10-CM | POA: Insufficient documentation

## 2013-11-27 DIAGNOSIS — Z8719 Personal history of other diseases of the digestive system: Secondary | ICD-10-CM | POA: Insufficient documentation

## 2013-11-27 DIAGNOSIS — Z8701 Personal history of pneumonia (recurrent): Secondary | ICD-10-CM | POA: Insufficient documentation

## 2013-11-27 DIAGNOSIS — S39012A Strain of muscle, fascia and tendon of lower back, initial encounter: Secondary | ICD-10-CM

## 2013-11-27 DIAGNOSIS — F3289 Other specified depressive episodes: Secondary | ICD-10-CM | POA: Insufficient documentation

## 2013-11-27 DIAGNOSIS — Y9389 Activity, other specified: Secondary | ICD-10-CM | POA: Insufficient documentation

## 2013-11-27 DIAGNOSIS — F329 Major depressive disorder, single episode, unspecified: Secondary | ICD-10-CM | POA: Insufficient documentation

## 2013-11-27 DIAGNOSIS — Z791 Long term (current) use of non-steroidal anti-inflammatories (NSAID): Secondary | ICD-10-CM | POA: Insufficient documentation

## 2013-11-27 DIAGNOSIS — S335XXA Sprain of ligaments of lumbar spine, initial encounter: Secondary | ICD-10-CM | POA: Insufficient documentation

## 2013-11-27 DIAGNOSIS — X58XXXA Exposure to other specified factors, initial encounter: Secondary | ICD-10-CM | POA: Insufficient documentation

## 2013-11-27 DIAGNOSIS — Z87891 Personal history of nicotine dependence: Secondary | ICD-10-CM | POA: Insufficient documentation

## 2013-11-27 DIAGNOSIS — E7800 Pure hypercholesterolemia, unspecified: Secondary | ICD-10-CM | POA: Insufficient documentation

## 2013-11-27 MED ORDER — CYCLOBENZAPRINE HCL 10 MG PO TABS: 10.0000 mg | ORAL_TABLET | Freq: Two times a day (BID) | ORAL | Status: DC | PRN

## 2013-11-27 MED ORDER — CYCLOBENZAPRINE HCL 10 MG PO TABS
10.0000 mg | ORAL_TABLET | Freq: Once | ORAL | Status: AC
  Administered 2013-11-27: 10 mg via ORAL
  Filled 2013-11-27: qty 1

## 2013-11-27 NOTE — ED Notes (Signed)
C/O lumbar spine pain x 2 weeks.  Has chronic lumbar pain w/baseline pain rating of 7/10 at best.  Over last week, he has had increased stiffness upon waking w/"back locking up" and inability to walk for about an hour.  States he has to get someone to help him up and pain is excruciating.  Sees Dr. Janna Archondiego but has never seen an orthopod or had MRI.  Denies loss of bowel or bladder control.  Denies LE parasthesias, but does have morning leg weakness.  Takes Naproxsyn w/minimal relief of symptoms, but heat to lumbar region helps relieve pain a little.

## 2013-11-27 NOTE — ED Notes (Signed)
Back pain, started bothering me last week and got worse per pt. Denies injury.

## 2013-11-27 NOTE — ED Provider Notes (Signed)
CSN: [id number]     Arrival date & time 11/27/13  2213 History   This chart was scribed for Hilario Quarry, MD, by Yevette Edwards, ED Scribe. This patient was seen in room APA01/APA01 and the patient's care was started at 10:42 PM. First MD Initiated Contact with Patient 11/27/13 2241     Chief Complaint  Patient presents with  . Back Pain   The history is provided by the patient. No language interpreter was used.   HPI Comments: Javier Cross is a 43 y.o. male who presents to the Emergency Department complaining of back pain which began last week and worsened today. The pt reports movement increases the pain. He rates the pain as 10/10, and he characterizes the pain as "sharp" causing his back to feel "locked and stiff." The pt has treated the pain with neurontin. He denies any recent injuries or falls. He denies numbness or tingling to his legs. He is ambulatory.   Dr. Delbert Harness is the pt's PCP. He has an appointment in five days.   Past Medical History  Diagnosis Date  . Depression   . High cholesterol   . Pneumonia     2013 right lung  . GERD (gastroesophageal reflux disease)     with certain foods  . Headache(784.0)     regular and migraines   Past Surgical History  Procedure Laterality Date  . Laparoscopic appendectomy N/A 05/12/2013    Procedure: APPENDECTOMY LAPAROSCOPIC;  Surgeon: Dalia Heading, MD;  Location: [facility];  Service: General;  Laterality: N/A;  . Appendectomy     Family History  Problem Relation Age of Onset  . Hypertension Mother   . Hyperlipidemia Mother   . Hypertension Father   . Hyperlipidemia Father    History  Substance Use Topics  . Smoking status: Former Games developer  . Smokeless tobacco: Former Neurosurgeon    Types: Chew  . Alcohol Use: No     Comment: rarely, beer socially    Review of Systems  Musculoskeletal: Positive for back pain.  Neurological: Negative for weakness and numbness.  All other systems reviewed and are negative.    Allergies   Darvocet  Home Medications   Current Outpatient Rx  Name  Route  Sig  Dispense  Refill  . LORazepam (ATIVAN) 1 MG tablet   Oral   Take 1 mg by mouth at bedtime.         . naproxen (NAPROSYN) 500 MG tablet   Oral   Take 1 tablet (500 mg total) by mouth 2 (two) times daily with a meal.   20 tablet   0   . PARoxetine (PAXIL) 40 MG tablet   Oral   Take 40 mg by mouth every morning.         . pravastatin (PRAVACHOL) 40 MG tablet   Oral   Take 40 mg by mouth every morning.           Triage Vitals: BP 139/77  Pulse 78  Temp(Src) 97.3 F (36.3 C) (Oral)  Ht 6' (1.829 m)  Wt 174 lb (78.926 kg)  BMI 23.59 kg/m2  SpO2 98%  Physical Exam  Nursing note and vitals reviewed. Constitutional: He is oriented to person, place, and time. He appears well-developed and well-nourished. No distress.  HENT:  Head: Normocephalic and atraumatic.  Right Ear: Tympanic membrane and external ear normal.  Left Ear: Tympanic membrane and external ear normal.  Nose: Nose normal. Right sinus exhibits no maxillary sinus  tenderness and no frontal sinus tenderness. Left sinus exhibits no maxillary sinus tenderness and no frontal sinus tenderness.  Eyes: Conjunctivae and EOM are normal. Pupils are equal, round, and reactive to light. Right eye exhibits no nystagmus. Left eye exhibits no nystagmus.  Neck: Normal range of motion. Neck supple. No tracheal deviation present.  Cardiovascular: Normal rate, regular rhythm, normal heart sounds and intact distal pulses.   Pulmonary/Chest: Effort normal and breath sounds normal. No respiratory distress. He exhibits no tenderness.  Abdominal: Soft. Bowel sounds are normal. He exhibits no distension and no mass. There is no tenderness.  Musculoskeletal: Normal range of motion. He exhibits no edema and no tenderness.  Neurological: He is alert and oriented to person, place, and time. He has normal strength and normal reflexes. No sensory deficit. He displays a  negative Romberg sign. GCS eye subscore is 4. GCS verbal subscore is 5. GCS motor subscore is 6.  Reflex Scores:      Tricep reflexes are 2+ on the right side and 2+ on the left side.      Bicep reflexes are 2+ on the right side and 2+ on the left side.      Brachioradialis reflexes are 2+ on the right side and 2+ on the left side.      Patellar reflexes are 2+ on the right side and 2+ on the left side.      Achilles reflexes are 2+ on the right side and 2+ on the left side. Patient with normal gait without ataxia, shuffling, spasm, or antalgia. Speech is normal without dysarthria, dysphasia, or aphasia. Muscle strength is 5/5 in bilateral shoulders, elbow flexor and extensors, wrist flexor and extensors, and intrinsic hand muscles. 5/5 bilateral lower extremity hip flexors, extensors, knee flexors and extensors, and ankle dorsi and plantar flexors.    Skin: Skin is warm and dry. No rash noted.  Psychiatric: He has a normal mood and affect. His behavior is normal. Judgment and thought content normal.    ED Course  Procedures (including critical care time)  DIAGNOSTIC STUDIES: Oxygen Saturation is 98% on room air, normal by my interpretation.    COORDINATION OF CARE:  10:50 PM- Discussed treatment plan with patient, and the patient agreed to the plan.   Labs Review  MDM   Final diagnoses:  Lumbar strain   I personally performed the services described in this documentation, which was scribed in my presence. The recorded information has been reviewed and considered.    Hilario Quarryanielle S Sitlali Koerner, MD 11/29/13 1556

## 2013-11-27 NOTE — Discharge Instructions (Signed)
Back Pain, Adult  Back pain is very common. The pain often gets better over time. The cause of back pain is usually not dangerous. Most people can learn to manage their back pain on their own.   HOME CARE   · Stay active. Start with short walks on flat ground if you can. Try to walk farther each day.  · Do not sit, drive, or stand in one place for more than 30 minutes. Do not stay in bed.  · Do not avoid exercise or work. Activity can help your back heal faster.  · Be careful when you bend or lift an object. Bend at your knees, keep the object close to you, and do not twist.  · Sleep on a firm mattress. Lie on your side, and bend your knees. If you lie on your back, put a pillow under your knees.  · Only take medicines as told by your doctor.  · Put ice on the injured area.  · Put ice in a plastic bag.  · Place a towel between your skin and the bag.  · Leave the ice on for 15-20 minutes, 03-04 times a day for the first 2 to 3 days. After that, you can switch between ice and heat packs.  · Ask your doctor about back exercises or massage.  · Avoid feeling anxious or stressed. Find good ways to deal with stress, such as exercise.  GET HELP RIGHT AWAY IF:   · Your pain does not go away with rest or medicine.  · Your pain does not go away in 1 week.  · You have new problems.  · You do not feel well.  · The pain spreads into your legs.  · You cannot control when you poop (bowel movement) or pee (urinate).  · Your arms or legs feel weak or lose feeling (numbness).  · You feel sick to your stomach (nauseous) or throw up (vomit).  · You have belly (abdominal) pain.  · You feel like you may pass out (faint).  MAKE SURE YOU:   · Understand these instructions.  · Will watch your condition.  · Will get help right away if you are not doing well or get worse.  Document Released: 03/12/2008 Document Revised: 12/17/2011 Document Reviewed: 02/12/2011  [employer]® Patient Information ©2014 [employer].

## 2013-11-28 NOTE — ED Notes (Signed)
Patient with no complaints at this time. Respirations even and unlabored. Skin warm/dry. Discharge instructions reviewed with patient at this time. Patient given opportunity to voice concerns/ask questions. Patient discharged at this time and left Emergency Department with steady gait.   

## 2013-12-02 ENCOUNTER — Other Ambulatory Visit (HOSPITAL_COMMUNITY): Payer: Self-pay | Admitting: Family Medicine

## 2013-12-02 DIAGNOSIS — M545 Low back pain, unspecified: Secondary | ICD-10-CM

## 2013-12-04 ENCOUNTER — Ambulatory Visit (HOSPITAL_COMMUNITY)
Admission: RE | Admit: 2013-12-04 | Discharge: 2013-12-04 | Disposition: A | Discharge status: Home / Self Care | Payer: Medicaid Other | Source: Ambulatory Visit | Attending: Family Medicine | Admitting: Family Medicine

## 2013-12-04 DIAGNOSIS — M5126 Other intervertebral disc displacement, lumbar region: Secondary | ICD-10-CM | POA: Insufficient documentation

## 2013-12-04 DIAGNOSIS — M545 Low back pain, unspecified: Secondary | ICD-10-CM

## 2014-04-02 ENCOUNTER — Emergency Department (HOSPITAL_COMMUNITY)
Admission: EM | Admit: 2014-04-02 | Discharge: 2014-04-02 | Disposition: A | Discharge status: Home / Self Care | Payer: Medicaid Other | Attending: Emergency Medicine | Admitting: Emergency Medicine

## 2014-04-02 ENCOUNTER — Encounter (HOSPITAL_COMMUNITY): Payer: Self-pay | Admitting: Emergency Medicine

## 2014-04-02 ENCOUNTER — Emergency Department (HOSPITAL_COMMUNITY): Payer: Medicaid Other

## 2014-04-02 DIAGNOSIS — E7800 Pure hypercholesterolemia, unspecified: Secondary | ICD-10-CM | POA: Insufficient documentation

## 2014-04-02 DIAGNOSIS — Z87891 Personal history of nicotine dependence: Secondary | ICD-10-CM | POA: Insufficient documentation

## 2014-04-02 DIAGNOSIS — F329 Major depressive disorder, single episode, unspecified: Secondary | ICD-10-CM | POA: Insufficient documentation

## 2014-04-02 DIAGNOSIS — Z8719 Personal history of other diseases of the digestive system: Secondary | ICD-10-CM | POA: Insufficient documentation

## 2014-04-02 DIAGNOSIS — Z8701 Personal history of pneumonia (recurrent): Secondary | ICD-10-CM | POA: Insufficient documentation

## 2014-04-02 DIAGNOSIS — F3289 Other specified depressive episodes: Secondary | ICD-10-CM | POA: Insufficient documentation

## 2014-04-02 DIAGNOSIS — R5383 Other fatigue: Principal | ICD-10-CM

## 2014-04-02 DIAGNOSIS — R5381 Other malaise: Secondary | ICD-10-CM | POA: Insufficient documentation

## 2014-04-02 DIAGNOSIS — R6889 Other general symptoms and signs: Secondary | ICD-10-CM

## 2014-04-02 NOTE — ED Provider Notes (Signed)
CSN: [id number]     Arrival date & time 04/02/14  2144 History   First MD Initiated Contact with Patient 04/02/14 2203    This chart was scribed for Lyanne CoKevin M Nyah Shepherd, MD by Marica OtterNusrat Rahman, ED Scribe. This patient was seen in room APA07/APA07 and the patient's care was started at 10:03 PM.  PCP: Isabella StallingNDIEGO,RICHARD M, MD  Chief Complaint  Patient presents with  . Shortness of Breath   HPI  HPI Comments: Javier Cross is a 43 y.o. male brought in by ambulance, who presents to the Emergency Department complaining of SOB onset this evening following a 7 mile walk. Specifically, pt reports that he began to experience SOB on his way back home (approximately the last 1 mile of his walk). Pt denies any recent fever, congestion, cough or any other upper respiratory Sx.    Past Medical History  Diagnosis Date  . Depression   . High cholesterol   . Pneumonia     2013 right lung  . GERD (gastroesophageal reflux disease)     with certain foods  . Headache(784.0)     regular and migraines   Past Surgical History  Procedure Laterality Date  . Laparoscopic appendectomy N/A 05/12/2013    Procedure: APPENDECTOMY LAPAROSCOPIC;  Surgeon: Dalia HeadingMark A Jenkins, MD;  Location: [facility];  Service: General;  Laterality: N/A;  . Appendectomy     Family History  Problem Relation Age of Onset  . Hypertension Mother   . Hyperlipidemia Mother   . Hypertension Father   . Hyperlipidemia Father    History  Substance Use Topics  . Smoking status: Former Games developermoker  . Smokeless tobacco: Former NeurosurgeonUser    Types: Chew  . Alcohol Use: No     Comment: rarely, beer socially    Review of Systems  A complete 10 system review of systems was obtained and all systems are negative except as noted in the HPI and PMH.    Allergies  Darvocet  Home Medications   Prior to Admission medications   Medication Sig Start Date End Date Taking? Authorizing Provider  PARoxetine (PAXIL) 40 MG tablet Take 40 mg by mouth every morning.    Yes Historical Provider, MD  pravastatin (PRAVACHOL) 40 MG tablet Take 40 mg by mouth every morning.    Yes Historical Provider, MD  QUEtiapine (SEROQUEL) 200 MG tablet Take 200 mg by mouth at bedtime.   Yes Historical Provider, MD   Triage Vitals: BP 116/76  Pulse 77  Temp(Src) 97.6 F (36.4 C) (Oral)  Resp 18  Ht 6' (1.829 m)  Wt 174 lb (78.926 kg)  BMI 23.59 kg/m2  SpO2 98% Physical Exam  Nursing note and vitals reviewed. Constitutional: He is oriented to person, place, and time. He appears well-developed and well-nourished.  HENT:  Head: Normocephalic and atraumatic.  Eyes: EOM are normal.  Neck: Normal range of motion.  Cardiovascular: Normal rate, regular rhythm, normal heart sounds and intact distal pulses.   Pulmonary/Chest: Effort normal and breath sounds normal. No respiratory distress.  Abdominal: Soft. He exhibits no distension. There is no tenderness.  Musculoskeletal: Normal range of motion.  Neurological: He is alert and oriented to person, place, and time.  Skin: Skin is warm and dry.  Psychiatric: He has a normal mood and affect. Judgment normal.    ED Course  Procedures (including critical care time) DIAGNOSTIC STUDIES: Oxygen Saturation is 98% on RA, normal by my interpretation.    COORDINATION OF CARE: 10:07 PM-Discussed treatment plan  which includes imaging and EKG with pt at bedside and pt agreed to plan.   Labs Review Labs Reviewed - No data to display  Imaging Review Dg Chest 2 View  04/02/2014   CLINICAL DATA:  Shortness of breath  EXAM: CHEST  2 VIEW  COMPARISON:  01/19/2013  FINDINGS: No cardiomegaly or change in mediastinal contours. There is chronic right apical pleural thickening and subpleural scarring. Chronic compression of the right middle lobe related to pectus excavatum. No consolidation, edema, effusion, or pneumothorax.  IMPRESSION: Stable exam.  No active cardiopulmonary disease.   Electronically Signed   By: Tiburcio PeaJonathan  Watts M.D.   On:  04/02/2014 22:59  I personally reviewed the imaging tests through PACS system I reviewed available ER/hospitalization records through the EMR    EKG Interpretation   Date/Time:  Friday April 02 2014 22:37:24 EDT Ventricular Rate:  84 PR Interval:  127 QRS Duration: 75 QT Interval:  472 QTC Calculation: 558 R Axis:   78 Text Interpretation:  Sinus rhythm Probable left atrial enlargement  Prolonged QT interval No significant change was found Confirmed by Ananda Caya   MD, Leanna Hamid ([id number]) on 04/02/2014 10:54:51 PM      MDM   Final diagnoses:  Exercise intolerance    This seems to be more exercise intolerance.  I think he ran too far with from town and was slightly afraid he would not make it back safely as it was turning dark..  He states he was both walking and running in an effort to try and get back into shape.  He denies active chest pain now.  No shortness of breath.  I do not believe this is acute coronary syndrome.  PCP followup.  I personally performed the services described in this documentation, which was scribed in my presence. The recorded information has been reviewed and is accurate.      Lyanne CoKevin M Miyeko Mahlum, MD 04/02/14 [phone]

## 2014-04-02 NOTE — ED Notes (Signed)
Per EMS[place] [facility] [place].

## 2016-01-17 IMAGING — CR DG CHEST 2V
2 series · 2 of 2 positions shown · non-contrast
Comparison: 01/19/2013

CLINICAL DATA: Shortness of breath

EXAM:
CHEST  2 VIEW

[view not recorded (1 of 2)]
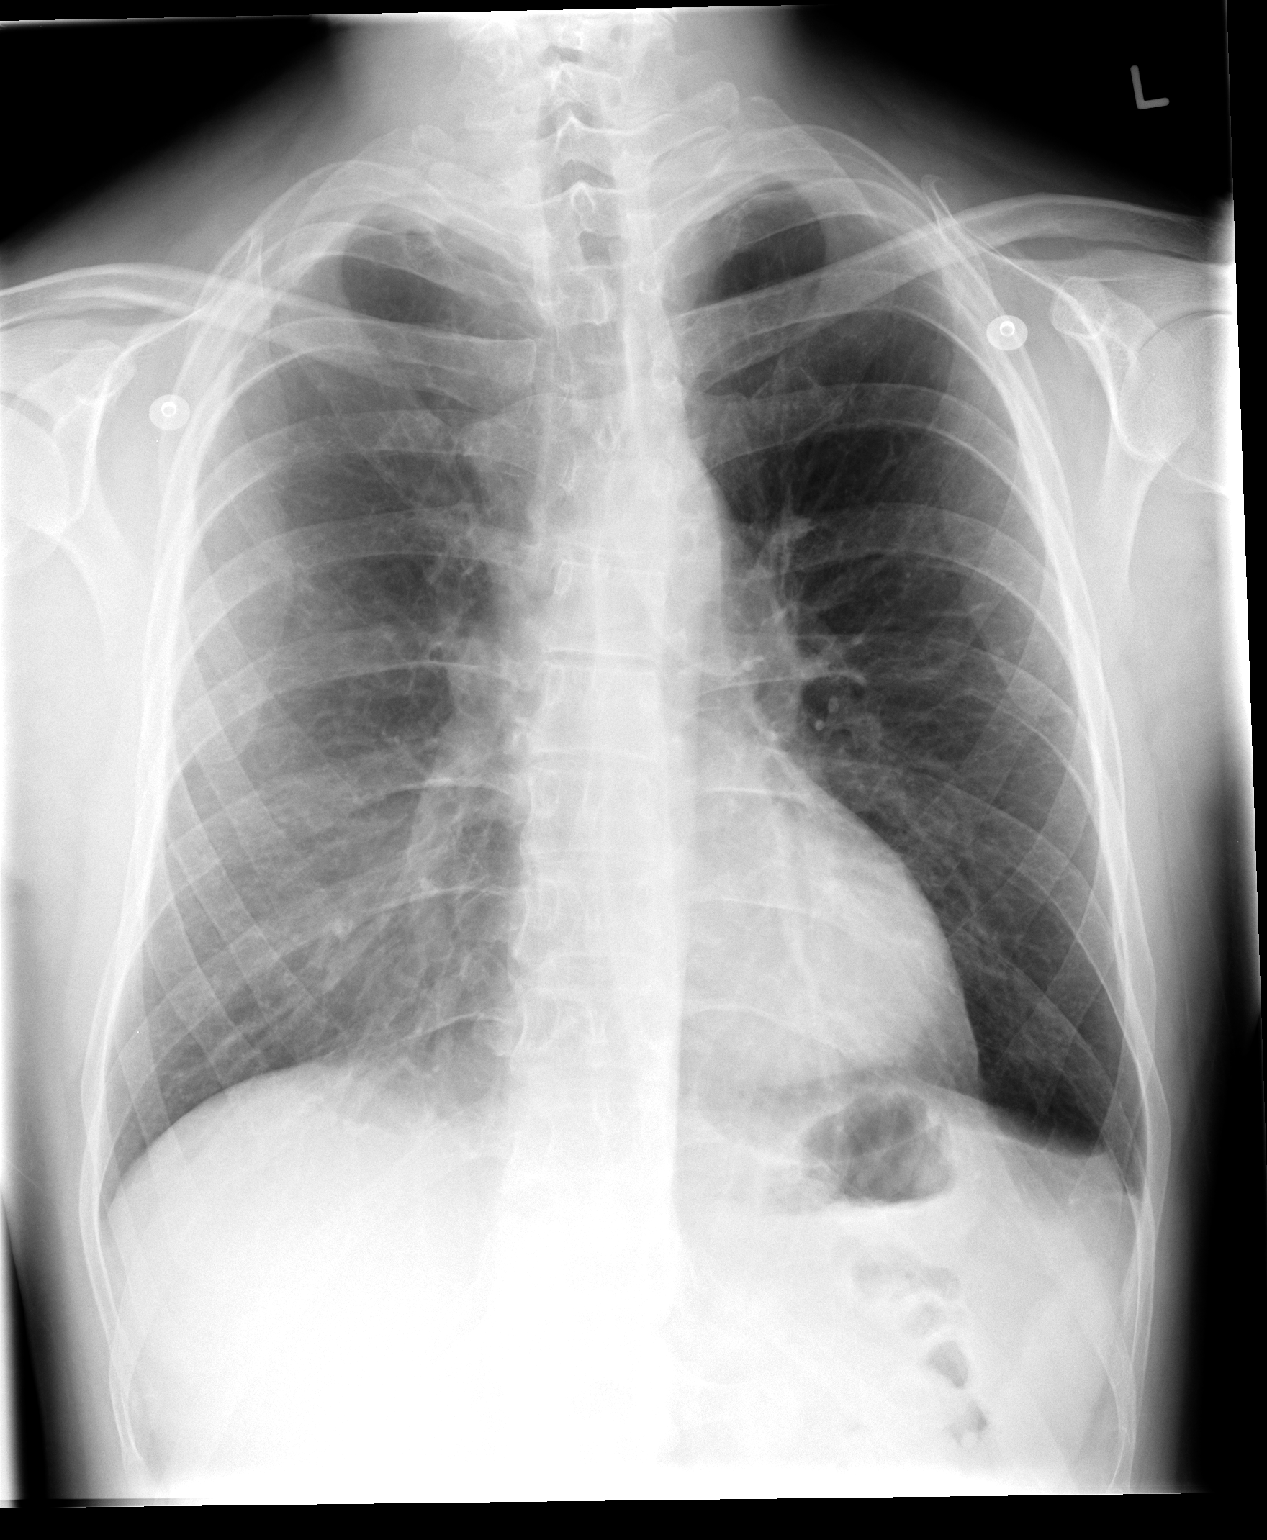

[view not recorded (2 of 2)]
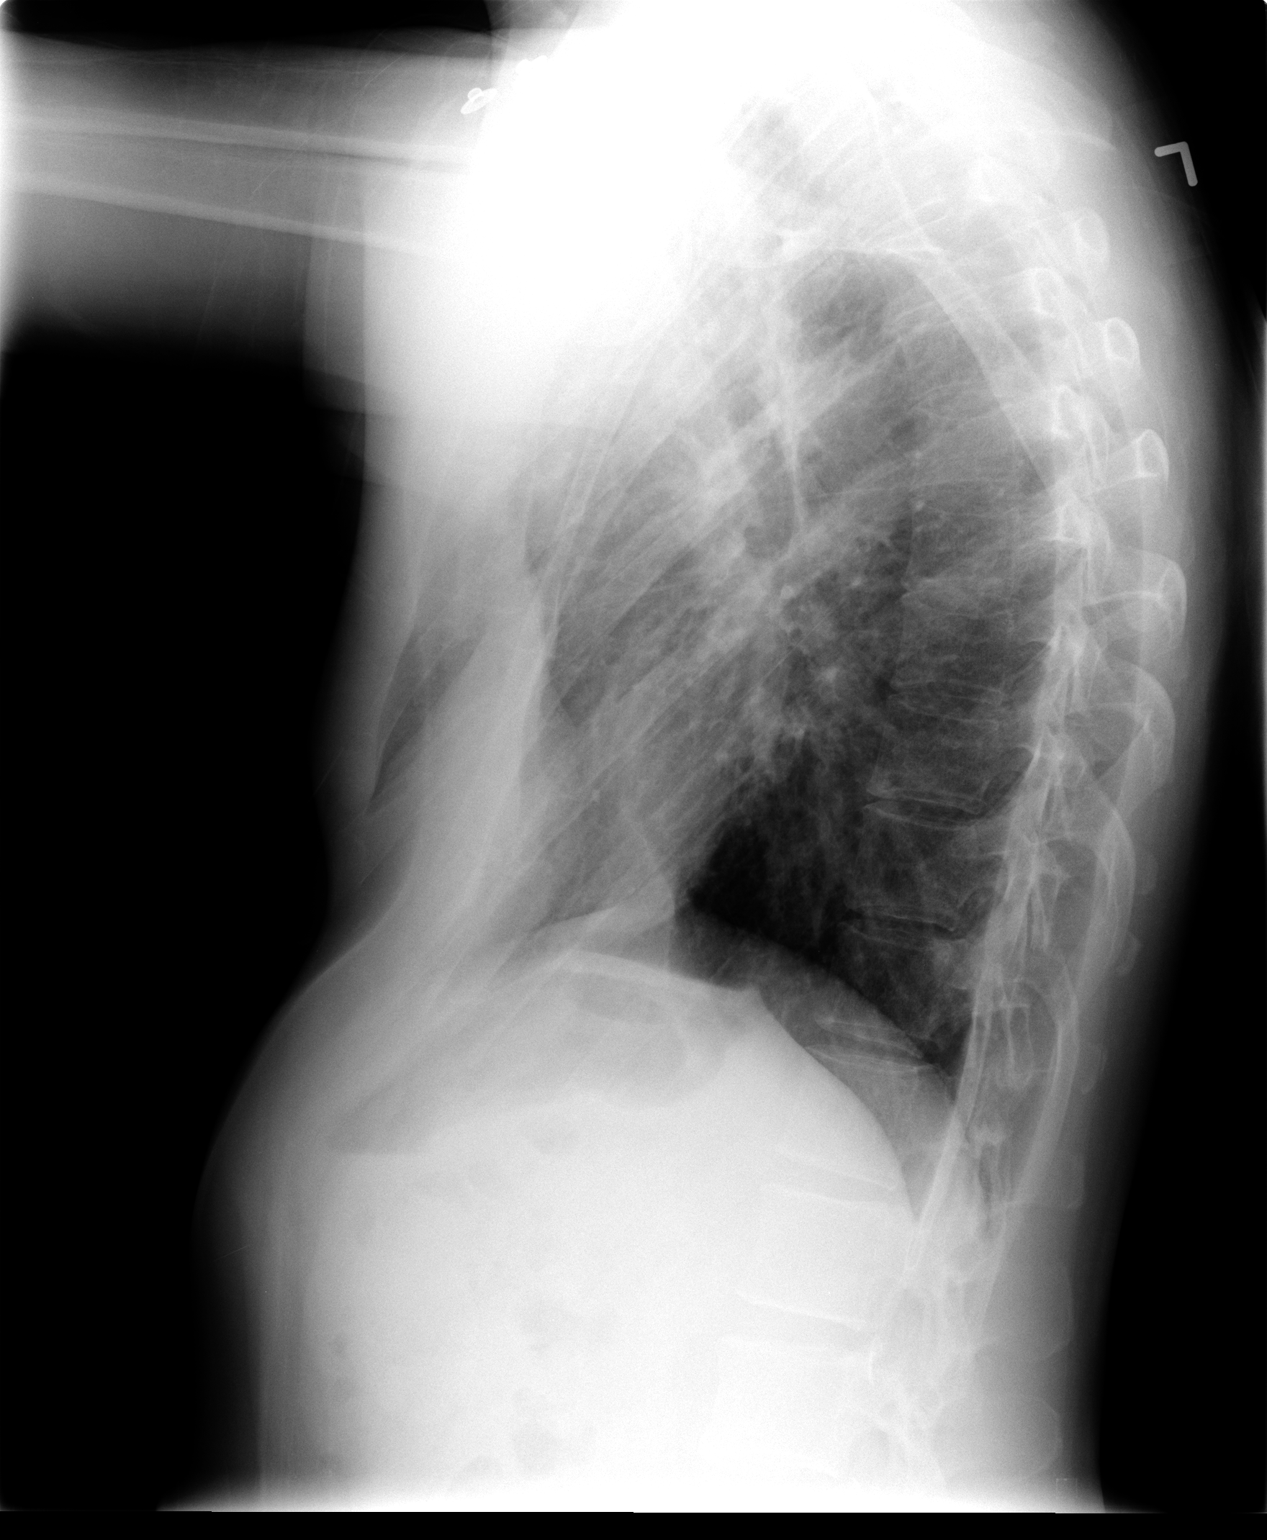

[2 of 2 positions shown; findings below may reference images not displayed]

FINDINGS: No cardiomegaly or change in mediastinal contours. There is chronic
right apical pleural thickening and subpleural scarring. Chronic
compression of the right middle lobe related to pectus excavatum. No
consolidation, edema, effusion, or pneumothorax.
IMPRESSION: Stable exam.  No active cardiopulmonary disease.
